# Patient Record
Sex: Male | Born: 1935 | Race: White | Smoking: Never smoker
Health system: Northeastern US, Academic
[De-identification: ages and names within clinical notes are randomized; demographics above are authoritative.]

## PROBLEM LIST (undated history)

## (undated) DIAGNOSIS — Z87442 Personal history of urinary calculi: Secondary | ICD-10-CM

## (undated) DIAGNOSIS — E785 Hyperlipidemia, unspecified: Secondary | ICD-10-CM

## (undated) DIAGNOSIS — L4050 Arthropathic psoriasis, unspecified: Secondary | ICD-10-CM

## (undated) DIAGNOSIS — E119 Type 2 diabetes mellitus without complications: Secondary | ICD-10-CM

## (undated) DIAGNOSIS — I2510 Atherosclerotic heart disease of native coronary artery without angina pectoris: Secondary | ICD-10-CM

## (undated) DIAGNOSIS — M6530 Trigger finger, unspecified finger: Secondary | ICD-10-CM

## (undated) DIAGNOSIS — I10 Essential (primary) hypertension: Secondary | ICD-10-CM

## (undated) HISTORY — PX: CHOLECYSTECTOMY: SHX55

## (undated) HISTORY — DX: Essential (primary) hypertension: I10

## (undated) HISTORY — PX: APPENDECTOMY: SHX54

## (undated) HISTORY — PX: UMBILICAL HERNIA REPAIR: SHX196

## (undated) HISTORY — DX: Arthropathic psoriasis, unspecified: L40.50

## (undated) HISTORY — PX: CORONARY ARTERY BYPASS GRAFT: SHX141

---

## 2000-05-27 DIAGNOSIS — Z951 Presence of aortocoronary bypass graft: Secondary | ICD-10-CM

## 2000-05-27 HISTORY — DX: Presence of aortocoronary bypass graft: Z95.1

## 2003-05-28 HISTORY — PX: CORONARY ARTERY BYPASS GRAFT: SHX141

## 2009-09-12 ENCOUNTER — Ambulatory Visit: Payer: Self-pay | Admitting: Ophthalmology

## 2009-09-13 ENCOUNTER — Ambulatory Visit: Payer: Self-pay | Admitting: Ophthalmology

## 2009-09-15 ENCOUNTER — Ambulatory Visit: Payer: Self-pay

## 2009-10-05 NOTE — H&P (Signed)
General H&P for inpatients    Chief Complaint: cataract    History of Present Illness:  HPI Comments: Pt states that he has been diagnosed with bil cataracts with the left eye worse than the right eye. It has somewhat affected his vision.  He is scheduled for surgery on 10/16/09       Past Medical History   Diagnosis Date   . Unspecified cataract 10/09/2009   . Diabetes mellitus    . Trigger finger      Past Surgical History   Procedure Date   . Appendectomy    . Cardiac surgery      Family History   Problem Relation Age of Onset   . Stroke Father    . Heart disease Neg Hx    . Diabetes Neg Hx    . Anesth problems Neg Hx      History   Social History   . Marital Status: Divorced     Spouse Name: N/A     Number of Children: N/A   . Years of Education: N/A   Social History Main Topics   . Smoking status: Never Smoker    . Smokeless tobacco: Never Used   . Alcohol Use: 4.2 oz/week     2 Glasses of wine, 5 Cans of beer per week   . Drug Use: No   . Sexually Active:    Other Topics Concern   . Not on file   Social History Narrative   . No narrative on file         Allergies: No Known Allergies    Prior to Admission Medications:    (Not in a hospital admission)      Active Hospital Medications:  Current outpatient prescriptions   Medication   . metformin (GLUCOPHAGE-XR) 500 MG 24 hr tablet   . metformin (GLUCOPHAGE-XR) 500 MG 24 hr tablet   . glimepiride (AMARYL) 2 MG tablet   . aspirin 81 MG EC tablet   . lisinopril (PRINIVIL,ZESTRIL) 5 MG tablet   . lovastatin (MEVACOR) 40 MG tablet         Review of Systems:   Review of Systems   Constitutional: Negative.    HENT: Negative for hearing loss, congestion and sore throat.         Crowns, implants   Eyes:        Cataracts- bil - left eye worse than right eye     Respiratory: Negative.  Negative for cough.    Cardiovascular: Negative for chest pain, palpitations and claudication.        Usual BP 120/69  Taking Lisinopril r/t diabetes  Denies murmur, valve disease  Testing:  stress/echo test 2009 after CABG  Dr Bebe Shaggy:  Last visit Spring 2010  Denies SOB with one stairs  Exercise: stationary bike and yardwork - very active   Gastrointestinal: Negative.    Genitourinary: Negative.    Musculoskeletal: Negative.    Skin: Negative.    Neurological: Negative.    Endo/Heme/Allergies: Negative for environmental allergies. Does not bruise/bleed easily.        Diabetes: treated about 9-10 years -   FBG - does not check - used to run 140  HgA1C = 6.5-7  Denies retinopathy, nephropathy, neuropathy   Psychiatric/Behavioral: Negative.        Last Nursing documented pain:  0-10 Scale: 0 (10/09/09 1347)      Patient Vitals in the past 24 hrs:   BP Temp Temp src Pulse Resp  SpO2 Height Weight   10/09/09 1347 154/79 mmHg 36.3 C (97.3 F) TEMPORAL 55  16  99 % 1.75 m (5' 8.9") 74.8 kg (164 lb 14.5 oz)       O2 Device: None  (10/09/09 1347)          Physical Exam   Constitutional: He is oriented to person, place, and time. He appears well-developed and well-nourished.   HENT:   Head: Normocephalic and atraumatic.   Right Ear: External ear normal.   Left Ear: External ear normal.   Mouth/Throat: Oropharynx is clear and moist.        Mild - diminished red reflex   Eyes: Conjunctivae and EOM are normal. Pupils are equal, round, and reactive to light.   Neck: Normal range of motion. Neck supple. No thyromegaly present.   Cardiovascular: Normal rate, regular rhythm, normal heart sounds and intact distal pulses.    Pulmonary/Chest: Effort normal and breath sounds normal.   Abdominal: Soft. Bowel sounds are normal.   Musculoskeletal: Normal range of motion.   Neurological: He is oriented to person, place, and time.   Skin: Skin is warm and dry.   Psychiatric: He has a normal mood and affect. His behavior is normal. Judgment and thought content normal.       Lab Results: none    Radiology impressions (last 3 days):  No results found.    Currently Active/Followed Hospital Problems:  Active Hospital Problems    Diagnoses   . Marland Kitchen*Unspecified cataract         Assessment: Ophthalmology Assessment: Duff Franchini is a 76 y.o. male who is diagnosed with  Left cataract  presenting for preop evaluation    Pt scheduled for  Left phaco w/crystalens IOL on 10/16/09  with Dr.  [place] Skene   [facility]     Ophthalmology Pre op Plan:  NPO after midnight  Verbalized knowledge and teaching objectives met  No barriers to learning identified  Teaching sheet reviewed with patient/family  IV insertion  Call surgeon if patient becomes ill prior to surgery  Instructed in pain scale/pain management  Care of valuables per [facility] policy  Instructed patient not to wear jewelry DOS  Questions answered  Medications DOS with sip of water: none  Hold medications AM day of surgery: Glimepiride, Lisinopril  Stop taking Metformin after AM dose on Sunday, May 22  Hold ASA/NSAIDS 7 days before surgery  Transportation home: arranged    Labs: EKG, BG  Lab DOS: BG    Author: Alphonzo Cruise  Note created: 10/09/2009  at: 2:34 PM

## 2009-10-09 ENCOUNTER — Encounter: Payer: Self-pay | Admitting: Ophthalmology

## 2009-10-09 ENCOUNTER — Ambulatory Visit: Payer: Self-pay | Admitting: Ophthalmology

## 2009-10-09 ENCOUNTER — Ambulatory Visit
Admit: 2009-10-09 | Discharge: 2009-10-09 | Disposition: A | Discharge status: Home / Self Care | Payer: Self-pay | Source: Ambulatory Visit | Attending: Ophthalmology | Admitting: Ophthalmology

## 2009-10-09 ENCOUNTER — Other Ambulatory Visit: Payer: Self-pay | Admitting: Gastroenterology

## 2009-10-09 DIAGNOSIS — H269 Unspecified cataract: Secondary | ICD-10-CM | POA: Insufficient documentation

## 2009-10-09 HISTORY — DX: Trigger finger, unspecified finger: M65.30

## 2009-10-09 HISTORY — DX: Type 2 diabetes mellitus without complications: E11.9

## 2009-10-09 HISTORY — DX: Unspecified cataract: H26.9

## 2009-10-09 LAB — POCT GLUCOSE: Glucose POCT: 79 mg/dL (ref 74–106)

## 2009-10-09 NOTE — Discharge Instructions (Addendum)
[facility]  Fax # [phone]  ADULT OPTHAMOLOGY   PREPROCEDURE PATIENT INSTRUCTIONS  SECOND FLOOR [facility]       NAME: Alejandro Stephenson    ADMISSION   DATE: Monday May 23       1) On Friday May 20  CALL [phone] between 2:30 PM and 4:30 PM on the day before your procedure to find out:    the time to arrive at the [facility] and the time of your procedure*  Note: Patients scheduled for a procedure on Monday should call the Friday before.  *Please note surgery start time is approximate. You may want to bring something to help pass the time. PLEASE ARRIVE ON TIME.      2) DIRECTIONS TO [facility]: On the day of your procedure, park in the parking garage and take the elevator/stairs to Level One (1), then follow the walkway to the Main Lobby. Walk past the Information Desk located in the center of the lobby and continue until you enter a main hallway with colored ceiling tags. Follow the GREEN (G) ceiling tags to the GREEN elevators.   (Please note that Valet Parking is available outside the front entrance of the hospital between 8:30 AM and 4:00 PM.)     [facility] - Second Floor (Level 2): take the GREEN elevators to the Second Floor (Level 2). Follow the signs to the [facility] - 2nd Floor [facility]) and check in with the receptionist at the desk.      3) EATING GUIDELINES: Follow the instructions below unless directed by your physician. Starting: Sunday May 22    Nothing to eat or drink after midnight (No candy, mints, gum or water) on the day of your surgery.    Failure to follow these instructions could lead to a delay or cancellation of your procedure.      4) MEDICATIONS:   5) On the morning of surgery, take your regular medications at the usual time or before leaving for the hospital, which ever comes first (unless otherwise instructed) including: none  6)  Do not take the following medications on the morning of surgery: Lisinopril,  Glimepiride,   7) Stop taking Metformin after your morning dose on Sunday, May 22        If you are on medications for diabetes, you should discuss the dose with your surgeon or nurse practitioner.    Aspirin/anti-inflammatory products: Do not take any aspirin products for 7 days before the procedure date, unless you are otherwise instructed by your health care provider.  Avoid non-steroidal anti-inflammatory agents such as Ibuprofen (Advil, Motrin) or Naproxen (Aleve) 7 days before the procedure.      Tylenol (Acetaminophen) may be taken if needed.    During your pre-operative visit, discuss all the medications you are currently taking with the anesthesiology team member. If you have any questions, please contact the [facility] during regular business hours at [phone], 8:00 AM to 4:00 PM.    8) Before coming to the hospital, remove all makeup, jewelry (including wedding band and hair accessories) and nail polish. Do not bring any valuables (money, wallet, purse, or jewelry).     9) Call your doctor if you become ill before the procedure day.    10) If you are going home on the same day as your surgery    Bring in any crutches or braces your surgeon has instructed you to bring.  Most people are ready for discharge one half hour to two hours after returning to the Surgical Center. You will be given discharge instructions before you go home.    11) Your family will be directed to a waiting area when you are taken to surgery.    We ask that only one or two family members accompany you on the day of your procedure.                  No children under the age of 65 are allowed as visitors in [facility]    Your family will be notified when your surgery is completed and you have arrived on the patient care unit.     12) Health standards require that a responsible adult must accompany any patient who has received anesthetics or sedatives and is going home the same day.     You must  arrange a ride home before coming to surgery.    11) Financial questions should be directed to [phone]

## 2009-10-10 ENCOUNTER — Ambulatory Visit: Payer: Self-pay | Admitting: Ophthalmology

## 2009-10-10 LAB — EKG 12-LEAD
P: 24 degrees
PR: 192 ms
QRS: 55 degrees
QRSD: 80 ms
QT: 424 ms
QTc: 395 ms
Rate: 52 {beats}/min
Severity: ABNORMAL
T: 23 degrees

## 2009-10-16 ENCOUNTER — Ambulatory Visit
Admit: 2009-10-16 | Discharge: 2009-10-16 | Disposition: A | Discharge status: Home / Self Care | Payer: Self-pay | Source: Ambulatory Visit | Attending: Ophthalmology | Admitting: Ophthalmology

## 2009-10-16 ENCOUNTER — Encounter: Payer: Self-pay | Admitting: Ophthalmology

## 2009-10-16 ENCOUNTER — Ambulatory Visit: Payer: Self-pay | Admitting: Ophthalmology

## 2009-10-16 HISTORY — DX: Atherosclerotic heart disease of native coronary artery without angina pectoris: I25.10

## 2009-10-16 LAB — POCT GLUCOSE
Glucose POCT: 77 mg/dL (ref 74–106)
Glucose POCT: 104 mg/dL (ref 74–106)

## 2009-10-16 MED ORDER — FLURBIPROFEN SODIUM 0.03 % OP SOLN *I*
1.0000 [drp] | Freq: Once | OPHTHALMIC | Status: AC
Start: 2009-10-16 — End: 2009-10-16

## 2009-10-16 MED ORDER — CYCLOPENTOLATE-PHENYLEPHRINE-MOXIFLOXACIN-TROPICAMIDE (VIGAMOX POWER) *I*
1.0000 [drp] | OPHTHALMIC | Status: AC
Start: 2009-10-16 — End: 2009-10-16
  Administered 2009-10-16 (×2): 1 [drp] via OPHTHALMIC

## 2009-10-16 MED ORDER — CYCLOPENTOLATE-PHENYLEPHRINE-MOXIFLOXACIN-TROPICAMIDE (VIGAMOX POWER) *I*
1.0000 [drp] | OPHTHALMIC | Status: AC
Start: 2009-10-16 — End: 2009-10-16

## 2009-10-16 MED ORDER — LACTATED RINGERS IV SOLN *I*
20.0000 mL/h | INTRAVENOUS | Status: DC
Start: 2009-10-16 — End: 2009-10-17
  Administered 2009-10-16: 20 mL/h via INTRAVENOUS

## 2009-10-16 MED ORDER — LIDOCAINE HCL 1 % IJ SOLN
0.1000 mL | INTRAMUSCULAR | Status: DC | PRN
Start: 2009-10-16 — End: 2009-10-17

## 2009-10-16 MED ORDER — SODIUM CHLORIDE 0.9 % IV SOLN WRAPPED *I*
20.0000 mL/h | Status: DC
Start: 2009-10-16 — End: 2009-10-17

## 2009-10-16 MED ORDER — FLURBIPROFEN SODIUM 0.03 % OP SOLN *I*
OPHTHALMIC | Status: AC
Start: 2009-10-16 — End: 2009-10-16
  Administered 2009-10-16: 1 [drp] via OPHTHALMIC
  Filled 2009-10-16: qty 2.5

## 2009-10-16 MED ORDER — CYCLOPENTOLATE-PHENYLEPHRINE-MOXIFLOXACIN-TROPICAMIDE (VIGAMOX POWER) *I*
OPHTHALMIC | Status: AC
Start: 2009-10-16 — End: 2009-10-16
  Administered 2009-10-16: 1 [drp] via OPHTHALMIC
  Filled 2009-10-16: qty 5

## 2009-10-16 NOTE — H&P (Signed)
UPDATES TO PATIENT'S CONDITION on the DAY OF SURGERY/PROCEDURE    I. Updates to Patient's Condition (to be completed by a provider privileged to complete a H&P, following reassessment of the patient by the provider):    Full H&P done ; no updates needed.       II. Procedure Readiness   I have reviewed the patient's H&P and updated condition. By completing and signing this form, I attest that this patient is ready for surgery.      III. Attestation   I have reviewed the updated information regarding the patient's condition and it is appropriate to proceed with the planned surgery/procedure.    Presley Raddle, MD as of 5:00 PM 10/16/2009

## 2009-10-16 NOTE — Anesthesia Pre-procedure Eval (Signed)
Anesthesia Pre-operative Evaluation for Alejandro Stephenson  Health History  Past Medical History   Diagnosis Date   . Unspecified cataract 10/09/2009   . Diabetes mellitus    . Trigger finger    . Coronary heart disease      s/p CABG       Past Surgical History   Procedure Date   . Appendectomy    . Coronary artery bypass graft 2005   . Cholecystectomy    . Umbilical hernia repair        Social History  History   Substance Use Topics   . Smoking status: Never Smoker    . Smokeless tobacco: Never Used   . Alcohol Use: 4.2 oz/week     2 Glasses of wine, 5 Cans of beer per week      History   Drug Use No       Allergies: No Known Allergies  Medications     (Not in a hospital admission)   Current outpatient prescriptions   Medication   . metformin (GLUCOPHAGE-XR) 500 MG 24 hr tablet   . metformin (GLUCOPHAGE-XR) 500 MG 24 hr tablet   . glimepiride (AMARYL) 2 MG tablet   . aspirin 81 MG EC tablet   . lisinopril (PRINIVIL,ZESTRIL) 5 MG tablet   . lovastatin (MEVACOR) 40 MG tablet   Current facility-administered medications   Medication Dose Route Frequency   . Lactated Ringers Infusion   20 mL/hr Intravenous Continuous   . sodium chloride IV  20 mL/hr Intravenous Continuous   . lidocaine 1 % injection 0.1 mL  0.1 mL Subcutaneous PRN   . flurbiprofen (OCUFEN) 0.03 % ophthalmic solution 1 drop  1 drop Left Eye Once   . Power Drop ophthalmic solution SOLN 1 drop  1 drop Left Eye Q5 Min   . Power Drop ophthalmic solution SOLN 1 drop  1 drop Left Eye Q30 Min       Medications Administered by Facility in Past 24hrs  flurbiprofen (OCUFEN) 0.03 % ophthalmic solution 1 drop    Date Action Dose Route User    10/16/2009 1432 Given 1 drop Left Eye Mary Sella, RN      Lactated Ringers Infusion     Date Action Dose Route User    10/16/2009 1442 New Bag 20 mL/hr Intravenous Mary Sella, RN      Power Drop ophthalmic solution SOLN 1 drop    Date Action Dose Route User    10/16/2009 1454 Given 1 drop Left Eye Mary Sella, RN    10/16/2009 1448 Given 1 drop Left Eye Mary Sella, RN    10/16/2009 1440 Given 1 drop Left Eye Mary Sella, RN             Anesthesia Evaluation      Airway   Mallampati: II  TM distance: >3 FB  Neck ROM: full  Dental    Comment: Crowns on lower  molars    Pulmonary - normal exam    breath sounds clear to auscultation  Cardiovascular - normal exam  Exercise tolerance: good (Does physical work and exercises regularly)  (+) CAD,   Rhythm: regular    Rate: normal  ROS comment: Asymptomatic CAD both before and after CABG.  No hypertension; lisinopril is for renal protection    Neuro/Psych - negative ROS   GI/Hepatic/Renal - negative ROS     Endo/Other    (+) Type II DM well controlled,   Abdominal  Additional ROS/Co-morbidities: None known    Mental Status: alert, oriented to person, place, and time, normal mood, behavior, speech, dress, motor activity, and thought processes    Last PO Intake: 7:00 pm 5/22    Most Recent Vitals: BP 136/73  Pulse 60  Temp(Src) 36.2 C (97.2 F) (Temporal)  Resp 18  Ht 1.75 m (5' 8.9")  Wt 73.6 kg (162 lb 4.1 oz)  BMI 24.03 kg/m2  SpO2 99%  Vital Sign Ranges (last 24hrs)  Temp:  [36.2 C (97.2 F)] 36.2 C (97.2 F)  Heart Rate:  [60] 60   Resp:  [18] 18   BP: (136)/(73) 136/73 mmHg   O2 Device: None  (10/16/09 1426)      Most Recent Lab Results   CBC  No results found for this basename: wbc,  hct,  plt      Chem-7  Lab Results   Component Value Date    PGLU 104 10/16/2009       No results found for this basename: GFR,  PGFRB,  EGFR,  GFRAA,  GFRNONAA        Electrolytes  No results found for this basename: Calcium,  Mg,  Phos      Coags  No results found for this basename: Protime,  INR,  APTT      LFTs  No results found for this basename: AST,  ALT,  ALKPHOS      No results found for this basename: HTBIL,  bili         Pregnancy Test (if applicable)   No results found for this basename: PUPT,   PREGTESTUR,   PREGSERUM,   HCG,   HCGQUANT        EKG  Results  SINUS RHYTHM  . CONSIDER ANTEROSEPTAL INFARCT  * NO PRIOR TRACING    BUN   No results found for this basename: BUN     CBC   No results found for this basename: WBC,  HGB,  HCT,  MCV,  PLT     Creatinine   No results found for this basename: creatinine       Platelets   No results found for this basename: PLT     Potassium   No results found for this basename: K     Sodium   No results found for this basename: sodium         Medical Problems  Patient Active Problem List   Diagnoses Date Noted   . Unspecified cataract [366.9] 10/09/2009       PreOp/PreAn Diagnosis: Cataract    Planned Procedure: ECCE    Anesthesia Plan    ASA 2   MAC with intravenous induction    Anesthetic plan and risks discussed with patient and spouse.    Plan discussed with resident.        Anesthesia Risks discussed: nausea, vomiting, airway problems, eye injury, allergic reaction, nerve injury, unexpected serious injury and death     Invasive Monitoring discussed:  none    Attending Attestation: The patient or proxy understand and accept the risks and benefits of the anesthesia plan. By accepting this note, I attest that I have personally performed the history and physical exam and prescribed the anesthetic plan within 48 hours prior to the anesthetic as documented by me above.    Author: Luiz Blare, MD Note created: 10/16/2009  at: 5:03 PM

## 2009-10-16 NOTE — Progress Notes (Signed)
1809  Returned from OR via Doctor, general practice.  Assisted OOB to recliner chair; steady on feet.   1820 Dr.  Skene in to see pt.

## 2009-10-16 NOTE — Progress Notes (Signed)
1850  Pt thought that his appt tomorrrow was @ 3:00 pm, Dr.Macrae phoned, and appointment for 5/24 confirmed to be @ 8:00.  Pt aware, and time writtenn on his d/c instructions.

## 2009-10-16 NOTE — Anesthesia Post-procedure Eval (Signed)
Anesthesia Post-op Note    Patient: Alejandro Stephenson    Procedure(s) Performed: cataract  Anesthesia type: MAC    Patient location: [facility]    Mental Status: Recovered to baseline    Patient able to participate in this evaluation: yes    Last Vitals: BP 136/73  Pulse 60  Temp(Src) 36.2 C (97.2 F) (Temporal)  Resp 18  Ht 1.75 m (5' 8.9")  Wt 73.6 kg (162 lb 4.1 oz)  BMI 24.03 kg/m2  SpO2 99%     Post-op vital signs noted above are within patient's normal range  Post-op vitals signs: stable  Respiratory function: baseline    Airway patent: Yes    Cardiovascular and hydration status stable: Yes    Post-Op pain: Adequate analgesia    Post-Op assessment: no apparent anesthetic complications    Complications: none    Attending Attestation: All indicated post anesthesia care provided    Author: Jodi Marble, MD  as of: 10/16/2009  at: 6:14 PM

## 2009-10-16 NOTE — Anesthesia Pre-procedure Eval (Signed)
Anesthesia Pre-operative Evaluation for Alejandro Stephenson  Health History  Past Medical History   Diagnosis Date   . Unspecified cataract 10/09/2009   . Diabetes mellitus    . Trigger finger    . Coronary heart disease      s/p CABG       Past Surgical History   Procedure Date   . Appendectomy    . Coronary artery bypass graft 2005   . Cholecystectomy    . Umbilical hernia repair        Social History  History   Substance Use Topics   . Smoking status: Never Smoker    . Smokeless tobacco: Never Used   . Alcohol Use: 4.2 oz/week     2 Glasses of wine, 5 Cans of beer per week      History   Drug Use No       Allergies: No Known Allergies  Medications     (Not in a hospital admission)   Current outpatient prescriptions   Medication   . metformin (GLUCOPHAGE-XR) 500 MG 24 hr tablet   . metformin (GLUCOPHAGE-XR) 500 MG 24 hr tablet   . glimepiride (AMARYL) 2 MG tablet   . aspirin 81 MG EC tablet   . lisinopril (PRINIVIL,ZESTRIL) 5 MG tablet   . lovastatin (MEVACOR) 40 MG tablet   Current facility-administered medications   Medication Dose Route Frequency   . Lactated Ringers Infusion   20 mL/hr Intravenous Continuous   . sodium chloride IV  20 mL/hr Intravenous Continuous   . lidocaine 1 % injection 0.1 mL  0.1 mL Subcutaneous PRN   . flurbiprofen (OCUFEN) 0.03 % ophthalmic solution 1 drop  1 drop Left Eye Once   . Power Drop ophthalmic solution SOLN 1 drop  1 drop Left Eye Q5 Min   . Power Drop ophthalmic solution SOLN 1 drop  1 drop Left Eye Q30 Min       Medications Administered by Facility in Past 24hrs  flurbiprofen (OCUFEN) 0.03 % ophthalmic solution 1 drop    Date Action Dose Route User    10/16/2009 1432 Given 1 drop Left Eye Mary Sella, RN      Lactated Ringers Infusion     Date Action Dose Route User    10/16/2009 1442 New Bag 20 mL/hr Intravenous Mary Sella, RN      Power Drop ophthalmic solution SOLN 1 drop    Date Action Dose Route User    10/16/2009 1454 Given 1 drop Left Eye Mary Sella, RN    10/16/2009 1448 Given 1 drop Left Eye Mary Sella, RN    10/16/2009 1440 Given 1 drop Left Eye Mary Sella, RN             Anesthesia Evaluation      Airway   Mallampati: II  TM distance: >3 FB  Neck ROM: full  Dental    Comment: Crowns on lower  molars    Pulmonary - normal exam    breath sounds clear to auscultation  Cardiovascular - normal exam  Exercise tolerance: good (Does physical work and exercises regularly)  (+) CAD,   Rhythm: regular    Rate: normal  ROS comment: Asymptomatic CAD both before and after CABG.  No hypertension; lisinopril is for renal protection    Neuro/Psych - negative ROS   GI/Hepatic/Renal - negative ROS     Endo/Other    (+) Type II DM well controlled,   Abdominal  Additional ROS/Co-morbidities: None known    Mental Status: alert, oriented to person, place, and time, normal mood, behavior, speech, dress, motor activity, and thought processes    Last PO Intake: 7:00 pm 5/22    Most Recent Vitals: BP 136/73  Pulse 60  Temp(Src) 36.2 C (97.2 F) (Temporal)  Resp 18  Ht 1.75 m (5' 8.9")  Wt 73.6 kg (162 lb 4.1 oz)  BMI 24.03 kg/m2  SpO2 99%  Vital Sign Ranges (last 24hrs)  Temp:  [36.2 C (97.2 F)] 36.2 C (97.2 F)  Heart Rate:  [60] 60   Resp:  [18] 18   BP: (136)/(73) 136/73 mmHg   O2 Device: None  (10/16/09 1426)      Most Recent Lab Results   CBC  No results found for this basename: wbc,  hct,  plt      Chem-7  Lab Results   Component Value Date    PGLU 104 10/16/2009       No results found for this basename: GFR,  PGFRB,  EGFR,  GFRAA,  GFRNONAA        Electrolytes  No results found for this basename: Calcium,  Mg,  Phos      Coags  No results found for this basename: Protime,  INR,  APTT      LFTs  No results found for this basename: AST,  ALT,  ALKPHOS      No results found for this basename: HTBIL,  bili         Pregnancy Test (if applicable)   No results found for this basename: PUPT,   PREGTESTUR,   PREGSERUM,   HCG,   HCGQUANT        EKG  Results  SINUS RHYTHM  . CONSIDER ANTEROSEPTAL INFARCT  * NO PRIOR TRACING    BUN   No results found for this basename: BUN     CBC   No results found for this basename: WBC,  HGB,  HCT,  MCV,  PLT     Creatinine   No results found for this basename: creatinine       Platelets   No results found for this basename: PLT     Potassium   No results found for this basename: K     Sodium   No results found for this basename: sodium         Medical Problems  Patient Active Problem List   Diagnoses Date Noted   . Unspecified cataract [366.9] 10/09/2009       PreOp/PreAn Diagnosis: Cataract    Planned Procedure: ECCE    Anesthesia Plan    ASA 2   MAC with intravenous induction    Anesthetic plan and risks discussed with patient and spouse.    Plan discussed with resident.        Anesthesia Risks discussed: nausea, vomiting, airway problems, eye injury, allergic reaction, nerve injury, unexpected serious injury and death     Invasive Monitoring discussed:  none    Attending Attestation: The patient or proxy understand and accept the risks and benefits of the anesthesia plan. By accepting this note, I attest that I have personally performed the history and physical exam and prescribed the anesthetic plan within 48 hours prior to the anesthetic as documented by me above.    Author: Fleeta Emmer, MD Note created: 10/16/2009  at: 4:16 PM

## 2009-10-16 NOTE — INTERIM OP NOTE (Signed)
Interim Operative Report    Surgeon: Hel[place] Skene  Co-Surgeon:   First Assistant:   Second Assistant:     Pre-Op Diagnosis: cataract os    Anesthesia Type: Monitored Anesthesia Care    Post-Op Diagnosis    Primarysame  Secondary:   Tertiary:     Additional Findings (Including unexpected complications): none    Procedure(s) Performed (including CPT 4 Code if available)   phaco with iol placement    Estimated Blood Loss:    Packing: No  Drains: No  Fluid Totals: Intakes:  Outputs:   Specimens to Pathology: no  Patient Condition: good    Arlando Leisinger MOHAMMED Mikka Kissner, MD as of 5:39 PM, 10/16/2009

## 2009-10-16 NOTE — Discharge Instructions (Signed)
Procedure:  Cataract Extraction left eye    You have received sedative medications and/or general anesthesia which make you drowsy for as long as 24 hours:  1) DO NOT drive or operate any machinery for 24hours.  2) DO NOT drink alcoholic beverages for 24hours.  3) DO NOT make major decisions, sign contract, etc. for 24hours.    Discomfort after Surgery  - A scratchy felling or occasional sharp pain is normal.  - You may take actaminophen (Tylenol) for mild pain.  - If you have a severe headache or nausea, call your doctor at [phone].    Eye care after surgery  - Leave the patch on until your appointment tomorrow.  - Keep the patch dry.  - Redness is common and gradually diminishes over time.  Blood stained tears can occur.  - In certain cases, positioning of the head will be necessary.    Activity after surgery  - You must avoid lifting heavy objects (over 5 pounds) and bending over.  - You may read and watch TV.  - You may drive when your doctor gives you permission.    Diet after surgery  - Resume previous diet.    Medications  - Resume usual medications  - New Medications:  Tylenol as needed for pain.     Follow up  - Please follow up as scheduled.  - At your appointment the doctor will remove the patch and you will be given care instructions.     - If you have an emergency and are unable to contact your doctor at the [facility] [phone]   [facility] Maxx Calaway, MD  10/16/2009

## 2009-10-17 ENCOUNTER — Ambulatory Visit: Payer: Self-pay | Admitting: Ophthalmology

## 2009-10-17 NOTE — Op Note (Signed)
SURGEON:  Presley Raddle, MD  CO-SURGEON:  ASSISTANT:  SURGERY DATE:  10/16/2009    PREOPERATIVE DIAGNOSIS:   Cataract, OS.    POSTOPERATIVE DIAGNOSIS:  Cataract, OS.    OPERATIVE PROCEDURE:      Phacoemulsification with Crystalens implantation,  OS.    ANESTHESIA: MAC.    DESCRIPTION OF PROCEDURE:              The patient was brought into the  operating room and prepped in the usual fashion.  Several drops of 5%  povidone-iodine were instilled into the cul-de-sac and the eye draped in  the usual fashion.  Lieberman lid speculum was then used to retract the  lids.  Intracameral lidocaine was injected into the [facility].  A 1 mm side-port  incision was performed.  A clear corneal 2.8 mm trapezoidal incision was  performed.  Viscoelastic was instilled into the posterior chamber.  A  continuous curvilinear capsulorrhexis was then performed.  Hydrodissection  and hydrodelineation were then performed.  The lens was removed using a  divide-and-conquer technique.  Irrigation and aspiration were used to  complete the cortical clean up.  The I/A aspiration instrument was also  used for gentle capsule polishing.  The right and left capsule polishers  were then used to further polish the anterior and posterior capsule,  removing any fine cortical microfilaments.  Viscoelastic was instilled into  the posterior chamber. A Crystalens [id number], serial [id number], posterior  chamber intraocular lens was implanted into the posterior chamber using  Crystalsert system.  The lens power was 19.5 diopters.  The intraocular  lens was then rotated.  Careful inspection was performed of all haptics and  they were in proper position in the posterior chamber.  Viscoelastic was  then removed using the irrigation and aspiration instrument.  The wounds  were then hydrated with balanced salt solution, and the anterior chamber  and posterior chambers were re-formed.  Vancomycin 0.1 cc (1 mg/0.1 cc) was  then instilled into the anterior chamber.  The  anterior chamber again was  re-inflated slightly with balanced salt solution to ensure wounds were  sealed.  The wounds were then carefully inspected with a Weck-cel sponge  and determined to be well sealed.  The intraocular pressure was checked  with Weck-cel pressure at the 6 o'clock position.  Intraocular pressure was  good, but not excessive.  The patient was given TobraDex ointment, Timoptic  0.5%, Iopidine topically and patched and shielded.  The patient was taken  to recovery in good condition.  There were no operative complications.  The  level of anesthesia was excellent.              Electronically Signed and Finalized  by  Presley Raddle, MD 10/19/2009 13:06  _____________________________________________  Presley Raddle, MD      DD:   10/16/2009  DT:   10/17/2009  7:54 A  DVI:  [id number]  SMM/SS2#[id number]    cc:   Presley Raddle, MD

## 2009-10-18 MED FILL — Fentanyl Citrate Preservative Free (PF) Inj 100 MCG/2ML: INTRAMUSCULAR | Qty: 2 | Status: AC

## 2009-10-18 MED FILL — Midazolam HCl Inj 2 MG/2ML (Base Equivalent): INTRAMUSCULAR | Qty: 2 | Status: AC

## 2009-10-25 ENCOUNTER — Ambulatory Visit: Payer: Self-pay | Admitting: Ophthalmology

## 2009-11-16 ENCOUNTER — Ambulatory Visit: Payer: Self-pay | Admitting: Ophthalmology

## 2009-11-28 ENCOUNTER — Ambulatory Visit: Payer: Self-pay | Admitting: Ophthalmology

## 2009-11-29 ENCOUNTER — Ambulatory Visit: Payer: Self-pay | Admitting: Ophthalmology

## 2009-12-06 ENCOUNTER — Ambulatory Visit: Payer: Self-pay | Admitting: Ophthalmology

## 2010-01-03 ENCOUNTER — Ambulatory Visit: Payer: Self-pay | Admitting: Ophthalmology

## 2010-03-28 ENCOUNTER — Ambulatory Visit: Payer: Self-pay | Admitting: Ophthalmology

## 2014-12-29 DIAGNOSIS — E785 Hyperlipidemia, unspecified: Secondary | ICD-10-CM | POA: Insufficient documentation

## 2014-12-29 DIAGNOSIS — I10 Essential (primary) hypertension: Secondary | ICD-10-CM | POA: Insufficient documentation

## 2014-12-30 ENCOUNTER — Ambulatory Visit: Payer: Self-pay | Admitting: Cardiology

## 2014-12-30 ENCOUNTER — Encounter: Payer: Self-pay | Admitting: Cardiology

## 2014-12-30 ENCOUNTER — Other Ambulatory Visit: Payer: Self-pay | Admitting: Gastroenterology

## 2014-12-30 DIAGNOSIS — I2510 Atherosclerotic heart disease of native coronary artery without angina pectoris: Secondary | ICD-10-CM

## 2014-12-30 NOTE — Progress Notes (Signed)
Cardiology Office Revisit Note    Date of Visit: 12/30/2014 Patient: Alejandro Stephenson   Patients PCP: Mannie Stabile, MD Patient DOB: May 09, 1934     Subjective/Reason For Visit     I had the pleasure of seeing Alejandro Stephenson in cardiology followup on 12/30/2014 for coronary artery disease.  81 year old gentleman with a history of bypass surgery November 2005.  Since I last saw him he's had no symptoms.  No chest pain or shortness of breath.  He spends the winters in [place].  He has some arthritis but otherwise stays active.    Past Medical History   Diagnosis Date    Coronary heart disease      s/p CABG    Diabetes mellitus     Hypertension     Psoriatic arthritis     Trigger finger     Unspecified cataract 10/09/2009     Past Surgical History   Procedure Laterality Date    Appendectomy      Coronary artery bypass graft  2005    Cholecystectomy      Umbilical hernia repair         Review of Systems   Constitutional: Negative.    HENT: Negative.    Eyes: Negative.    Respiratory: Negative.    Cardiovascular: Negative.    Gastrointestinal: Negative.    Genitourinary: Negative.    Musculoskeletal: Positive for joint pain.   Skin: Negative.    Neurological: Negative.    Endo/Heme/Allergies: Negative.    Psychiatric/Behavioral: Negative.      Medications     Current Outpatient Prescriptions   Medication Sig    sulfaSALAzine (AZULFIDINE) 500 MG tablet Take 500 mg by mouth 2 times daily    metformin (GLUCOPHAGE-XR) 500 MG 24 hr tablet Take 1,000 mg by mouth 2 times daily       glimepiride (AMARYL) 2 MG tablet Take 4 mg by mouth 2 times daily       aspirin 81 MG EC tablet Take 81 mg by mouth every morning.    lisinopril (PRINIVIL,ZESTRIL) 5 MG tablet Take 5 mg by mouth daily.    lovastatin (MEVACOR) 40 MG tablet Take 40 mg by mouth nightly.     Vitals and Physical Exam     [name]'s  height is 1.702 m ( ) and weight is 69.4 kg (153 lb). His blood pressure is 120/62 and his pulse is 69.  Body mass index is  23.96 kg/(m^2).    Physical Exam   Constitutional: He is oriented to person, place, and time. He appears well-developed and well-nourished. No distress.   HENT:   Head: Normocephalic and atraumatic.   Eyes: Pupils are equal, round, and reactive to light. No scleral icterus.   Neck: Normal range of motion. Neck supple. No JVD present. Carotid bruit is not present. No thyromegaly present.   Cardiovascular: Normal rate, regular rhythm and intact distal pulses.  Exam reveals no gallop and no friction rub.    Murmur heard.  2/6 systolic murmur   Pulmonary/Chest: Effort normal and breath sounds normal. No respiratory distress. He has no wheezes. He has no rales.   Abdominal: Soft. Bowel sounds are normal. There is no tenderness.   Musculoskeletal: He exhibits no edema or tenderness.   Neurological: He is alert and oriented to person, place, and time.   Skin: Skin is warm and dry. No rash noted. He is not diaphoretic. No erythema. No pallor.   Psychiatric: He has a normal mood and affect.  His behavior is normal. Judgment and thought content normal.   Vitals reviewed.    Cardiac/Imaging Data     ECG: NSR.        Impression and Plan     Patient Active Problem List   Diagnosis Code    Unspecified cataract H26.9    HTN (hypertension) I10    Hyperlipidemia E78.5    Coronary heart disease I25.10       This is an 81 y.o. male with history of bypass surgery 11 years ago.  Clinically stable.  We'll continue current medications.  Continue risk factor modification.  We'll check stress echo at next visit.  He has a murmur of aortic stenosis developing.  If he has symptoms he'll get in contact with [place].  Return to practice next May when he returns from [place].  All questions answered.       Madelaine Bhat, MD  Electronically signed on 12/30/2014 at 1:21 PM.

## 2015-01-02 LAB — EKG 12-LEAD
P: -11 degrees
QRS: 71 degrees
Rate: 69 {beats}/min
Severity: ABNORMAL
Severity: ABNORMAL
T: 39 degrees

## 2015-11-03 ENCOUNTER — Ambulatory Visit: Admit: 2015-11-03 | Discharge: 2015-11-03 | Disposition: A | Discharge status: Home / Self Care | Payer: Self-pay

## 2015-11-03 ENCOUNTER — Ambulatory Visit: Payer: Self-pay | Admitting: Cardiology

## 2015-11-03 ENCOUNTER — Ambulatory Visit: Admission: RE | Admit: 2015-11-03 | Payer: Self-pay | Source: Ambulatory Visit | Admitting: Cardiology

## 2015-11-03 VITALS — BP 132/72 | HR 60 | Ht 67.0 in | Wt 153.0 lb

## 2015-11-03 DIAGNOSIS — I2510 Atherosclerotic heart disease of native coronary artery without angina pectoris: Secondary | ICD-10-CM

## 2015-11-03 HISTORY — DX: Hyperlipidemia, unspecified: E78.5

## 2015-11-03 LAB — EXERCISE STRESS ECHO COMPLETE
Aortic Arch Diameter: 3.15 cm
Aortic Diameter (mid tubular): 3.61 cm
Aortic Diameter (sinus of Valsalva): 3.26 cm
BMI: 24 kg/m2
BP Diastolic: 72 mmHg
BP Systolic: 132 mmHg
BSA: 1.81 m2
E/A ratio: 0.66
Estimated workload: 7 METS
Heart Rate: 54 {beats}/min
Height: 67 in
LA Systolic Diameter: 3.26 cm
LV ASE Mass BSA Index: 75.9 gm/m2
LV ASE Mass Height 2.7 Index: 32.7 gm/m2.7
LV ASE Mass Height Index: 80.8 gm/m
LV ASE Mass: 137.5 gm
LV Posterior Wall Thickness: 1.01 cm
LV Septal Thickness: 0.96 cm
LV Subaortic Knuckle Thickness: 1.6 cm
LVED Diameter BSA Index: 2.4 cm/m2
LVED Diameter Height Index: 2.5 cm/m
LVED Diameter: 4.26 cm
LVES Diameter BSA Index: 1.6 cm/m2
LVES Diameter Height Index: 1.7 cm/m
LVES Diameter: 2.88 cm
LVOT Area (calculated): 3.14 cm2
LVOT Cardiac Index: 2.98 L/min/m2
LVOT Cardiac Output: 5.39 L/min
LVOT Diameter: 2 cm
LVOT PWD VTI: 31.8 cm
LVOT PWD Velocity (mean): 89.6 cm/s
LVOT PWD Velocity (peak): 130 cm/s
LVOT SV BSA Index: 55.17 mL/m2
LVOT SV Height Index: 58.7 mL/m
LVOT Stroke Rate (mean): 281.3 mL/s
LVOT Stroke Rate (peak): 408.2 mL/s
LVOT Stroke Volume: 99.85 cc
MPHR: 141 {beats}/min
MV Peak A Velocity: 126 cm/s
MV Peak E Velocity: 83.4 cm/s
Peak DBP: 68 mmHg
Peak Gradient - TR: 16 mmHg
Peak HR: 143 {beats}/min
Peak SBP: 172 mmHg
Percent MPHR: 101.4 %
RA Pressure Estimate: 3 mmHg
RPP: 24596 BPM x mmHG
RR Interval: 1111.11 ms
RV Peak Systolic Pressure: 19 mmHg
Stress Peak Stage: 1.99
Stress duration (min): 5 min
Stress duration (sec): 58 s
Weight: 2448 oz

## 2015-11-03 NOTE — Progress Notes (Signed)
Cardiology Office Revisit Note    Date of Visit: 11/03/2015 Patient: Alejandro Stephenson   Patients PCP: Alejandro Stephenson, Neal T, MD Patient DOB: Apr 04, 1934     Subjective/Reason For Visit     I had the pleasure of seeing Alejandro Stephenson in cardiology followup on 11/03/2015 for coronary artery disease.  82 year old gentleman with a history of bypass surgery 12 years ago.  At that time he was fairly asymptomatic.  He had a very positive stress test.  He's done well since.  He remains quite active.    Past Medical History:   Diagnosis Date    Coronary heart disease     s/p CABG    Diabetes mellitus     Hyperlipidemia     Hypertension     Psoriatic arthritis     Trigger finger     Unspecified cataract 10/09/2009     Past Surgical History:   Procedure Laterality Date    APPENDECTOMY      CHOLECYSTECTOMY      CORONARY ARTERY BYPASS GRAFT  2005    UMBILICAL HERNIA REPAIR         Review of Systems   Constitutional: Negative.    HENT: Negative.    Eyes: Negative.    Respiratory: Negative.    Cardiovascular: Negative.    Gastrointestinal: Negative.    Genitourinary: Negative.    Musculoskeletal: Positive for joint pain.   Skin: Negative.    Neurological: Negative.    Endo/Heme/Allergies: Negative.    Psychiatric/Behavioral: Negative.      Medications     Current Outpatient Prescriptions   Medication Sig    sulfaSALAzine (AZULFIDINE) 500 MG tablet Take 500 mg by mouth 2 times daily    metformin (GLUCOPHAGE-XR) 500 MG 24 hr tablet Take 1,000 mg by mouth 2 times daily       glimepiride (AMARYL) 2 MG tablet Take 4 mg by mouth 2 times daily       aspirin 81 MG EC tablet Take 81 mg by mouth every morning.    lisinopril (PRINIVIL,ZESTRIL) 5 MG tablet Take 5 mg by mouth daily.    lovastatin (MEVACOR) 40 MG tablet Take 40 mg by mouth nightly.     Vitals and Physical Exam     [name]'s  height is 1.702 m (5\' 7" ) and weight is 69.4 kg (153 lb). His blood pressure is 132/72 and his pulse is 60.  Body mass index is 23.96  kg/(m^2).    Physical Exam   Constitutional: He is oriented to person, place, and time. He appears well-developed and well-nourished. No distress.   HENT:   Head: Normocephalic and atraumatic.   Eyes: Pupils are equal, round, and reactive to light. No scleral icterus.   Neck: Normal range of motion. Neck supple. No JVD present. Carotid bruit is not present. No thyromegaly present.   Cardiovascular: Normal rate, regular rhythm and intact distal pulses.  Exam reveals no gallop and no friction rub.    No murmur heard.  Well-healed median sternotomy.   Pulmonary/Chest: Effort normal and breath sounds normal. No respiratory distress. He has no wheezes. He has no rales.   Abdominal: Soft. Bowel sounds are normal. There is no tenderness.   Musculoskeletal: He exhibits no edema or tenderness.   Neurological: He is alert and oriented to person, place, and time.   Skin: Skin is warm and dry. No rash noted. He is not diaphoretic. No erythema. No pallor.   Psychiatric: He has a normal mood and affect. His behavior  is normal. Judgment and thought content normal.   Vitals reviewed.    Cardiac/Imaging Data            Exercise Stress Echo Complete 11/03/2015    Narrative Normal LV systolic function.  No significant valvular heart disease.  No echocardiographic evidence of ischemia.  No exercise-induced chest pain.                   Impression and Plan     Patient Active Problem List   Diagnosis Code    Unspecified cataract H26.9    HTN (hypertension) I10    Hyperlipidemia E78.5    Coronary heart disease I25.10       This is an 82 y.o. male with coronary artery disease but no inducible ischemia.  We'll continue with risk factor modification.  He wants to stop his medications but I told him to continue.  They've gotten him this far.  He'll continue to exercise and diet.  All questions answered.  Return to practice 12 months.       Madelaine Bhat, MD  Electronically signed on 11/03/2015 at 2:19 PM.

## 2016-11-20 ENCOUNTER — Encounter: Payer: Self-pay | Admitting: Cardiology

## 2016-11-20 ENCOUNTER — Other Ambulatory Visit: Payer: Self-pay | Admitting: Cardiology

## 2016-11-20 ENCOUNTER — Ambulatory Visit: Payer: Medicare (Managed Care) | Attending: Cardiology | Admitting: Cardiology

## 2016-11-20 VITALS — BP 160/90 | HR 52 | Ht 70.0 in | Wt 149.0 lb

## 2016-11-20 DIAGNOSIS — I2510 Atherosclerotic heart disease of native coronary artery without angina pectoris: Secondary | ICD-10-CM

## 2016-11-20 NOTE — Progress Notes (Signed)
Cardiology Office Revisit Note    Date of Visit: 11/20/2016 Patient: Alejandro Stephenson   Patients PCP: Mannie Stabile, MD Patient DOB: 05-Feb-1934     Subjective/Reason For Visit     I had the pleasure of seeing Alejandro Stephenson in cardiology followup on 11/20/2016 for coronary artery disease status post bypass surgery in 2005.  He's never had symptoms.  He's been fairly resistant to taking medications as well.  He's going to be moving to [place].    Past Medical History:   Diagnosis Date    Coronary heart disease     s/p CABG    Diabetes mellitus     Hyperlipidemia     Hypertension     Psoriatic arthritis     Trigger finger     Unspecified cataract 10/09/2009     Past Surgical History:   Procedure Laterality Date    APPENDECTOMY      CHOLECYSTECTOMY      CORONARY ARTERY BYPASS GRAFT  2005    LIMA to LAD, SVG diag, OM and PDA separate.    UMBILICAL HERNIA REPAIR         Review of Systems   Constitutional: Negative.    HENT: Negative.    Eyes: Negative.    Respiratory: Negative.    Cardiovascular: Negative.    Gastrointestinal: Negative.    Genitourinary: Negative.    Musculoskeletal: Positive for joint pain.   Skin: Negative.    Neurological: Negative.    Endo/Heme/Allergies: Negative.    Psychiatric/Behavioral: Negative.      Medications     Current Outpatient Prescriptions   Medication Sig    sulfaSALAzine (AZULFIDINE) 500 MG tablet Take 500 mg by mouth 2 times daily    metformin (GLUCOPHAGE-XR) 500 MG 24 hr tablet Take 1,000 mg by mouth 2 times daily       glimepiride (AMARYL) 2 MG tablet Take 4 mg by mouth 2 times daily       aspirin 81 MG EC tablet Take 81 mg by mouth every morning.    lisinopril (PRINIVIL,ZESTRIL) 5 MG tablet Take 5 mg by mouth daily.    lovastatin (MEVACOR) 40 MG tablet Take 40 mg by mouth nightly.     Vitals and Physical Exam     [name]'s  height is 1.778 m (5\' 10" ) and weight is 67.6 kg (149 lb). His blood pressure is 160/90 and his pulse is 52.  Body mass index is 21.38  kg/(m^2).    Physical Exam   Constitutional: He is oriented to person, place, and time. He appears well-developed and well-nourished. No distress.   HENT:   Head: Normocephalic and atraumatic.   Eyes: Pupils are equal, round, and reactive to light. No scleral icterus.   Neck: Normal range of motion. Neck supple. No JVD present. Carotid bruit is not present. No thyromegaly present.   Cardiovascular: Normal rate, regular rhythm and intact distal pulses.  Exam reveals no gallop and no friction rub.    Murmur heard.  3/6 systolic murmur   Pulmonary/Chest: Effort normal and breath sounds normal. No respiratory distress. He has no wheezes. He has no rales.   Abdominal: Soft. Bowel sounds are normal. There is no tenderness.   Musculoskeletal: He exhibits no edema or tenderness.   Neurological: He is alert and oriented to person, place, and time.   Skin: Skin is warm and dry. No rash noted. He is not diaphoretic. No erythema. No pallor.   Psychiatric: He has a normal mood and affect.  His behavior is normal. Judgment and thought content normal.   Vitals reviewed.    Laboratory Data     [facility] reviewed from this month.    Cardiac/Imaging Data     ECG: NSR.         Exercise Stress Echo Complete 11/03/2015    Narrative Normal LV systolic function.  No significant valvular heart disease.  No echocardiographic evidence of ischemia.  No exercise-induced chest pain.                   Impression and Plan     Patient Active Problem List   Diagnosis Code    Unspecified cataract H26.9    HTN (hypertension) I10    Hyperlipidemia E78.5    Coronary heart disease I25.10       This is an 83 y.o. male with history as stated above.    #1.  Coronary artery disease status post bypass 4 13 years ago.  Negative stress testing a year ago.  No testing required at this time.  Continue current medications and lifestyle modification.    #2.  Hypertension.  Blood pressures been elevated at times.  Would increase lisinopril  pressures remain elevated.    #3.  Hyperlipidemia.  Patient does not want to change statins.  Consider high-dose statin in the future.    Return to practice as needed.  Moving to [place].  All questions answered.       Madelaine BhatEDUARDO A Karen Huhta, MD  Electronically signed on 11/20/2016 at 1:53 PM.

## 2016-11-25 LAB — EKG 12-LEAD
P: 40 deg
PR: 212 ms
QRS: 64 deg
QRSD: 88 ms
QT: 408 ms
QTc: 376 ms
Rate: 51 {beats}/min
Severity: ABNORMAL
Statement: ABNORMAL
T: 54 deg

## 2017-01-28 ENCOUNTER — Encounter: Payer: Self-pay | Admitting: Gastroenterology

## 2017-02-24 ENCOUNTER — Ambulatory Visit: Payer: Medicare (Managed Care) | Attending: Ophthalmology | Admitting: Ophthalmology

## 2017-02-24 ENCOUNTER — Encounter: Payer: Self-pay | Admitting: Ophthalmology

## 2017-02-24 DIAGNOSIS — Z961 Presence of intraocular lens: Secondary | ICD-10-CM

## 2017-02-24 DIAGNOSIS — H2511 Age-related nuclear cataract, right eye: Secondary | ICD-10-CM

## 2017-02-24 DIAGNOSIS — H25011 Cortical age-related cataract, right eye: Secondary | ICD-10-CM

## 2017-02-24 MED ORDER — MOXIFLOXACIN HCL 0.5 % OP SOLN *I*
1.0000 [drp] | Freq: Four times a day (QID) | OPHTHALMIC | 1 refills | Status: AC
Start: 2017-02-28 — End: ?

## 2017-02-24 MED ORDER — PREDNISOLONE ACETATE 1 % OP SUSP *I*
1.0000 [drp] | Freq: Four times a day (QID) | OPHTHALMIC | 5 refills | Status: AC
Start: 2017-02-28 — End: ?

## 2017-02-24 MED ORDER — KETOROLAC TROMETHAMINE 0.5 % OP SOLN *I*
1.0000 [drp] | Freq: Four times a day (QID) | OPHTHALMIC | 5 refills | Status: DC
Start: 2017-02-28 — End: 2017-06-06

## 2017-02-24 NOTE — Progress Notes (Signed)
Subjective:   Subjective   Alejandro Stephenson 02/24/2017   [birth year] 83 y.o. male Referring Dr./Source existing patient   Chief Complaint   Patient presents with    Blurred Vision         Past Medical History:   Diagnosis Date    Coronary heart disease     s/p CABG    Diabetes mellitus     Hyperlipidemia     Hypertension     Psoriatic arthritis     Trigger finger     Unspecified cataract 10/09/2009      Past Surgical History:   Procedure Laterality Date    APPENDECTOMY      CHOLECYSTECTOMY      CORONARY ARTERY BYPASS GRAFT  2005    LIMA to LAD, SVG diag, OM and PDA separate.    UMBILICAL HERNIA REPAIR        History   Smoking Status    Never Smoker   Smokeless Tobacco    Never Used      History   Alcohol Use    4.2 oz/week    2 Glasses of wine, 5 Cans of beer per week      History   Drug Use No        Current Outpatient Prescriptions:     JARDIANCE 10 MG tablet, , Disp: , Rfl: 0    sulfaSALAzine (AZULFIDINE) 500 MG tablet, Take 500 mg by mouth 2 times daily, Disp: , Rfl:     metformin (GLUCOPHAGE-XR) 500 MG 24 hr tablet, Take 1,000 mg by mouth 2 times daily   , Disp: , Rfl:     glimepiride (AMARYL) 2 MG tablet, Take 4 mg by mouth 2 times daily   , Disp: , Rfl:     aspirin 81 MG EC tablet, Take 81 mg by mouth every morning., Disp: , Rfl:     lisinopril (PRINIVIL,ZESTRIL) 5 MG tablet, Take 5 mg by mouth daily., Disp: , Rfl:     lovastatin (MEVACOR) 40 MG tablet, Take 40 mg by mouth nightly., Disp: , Rfl:    Review of patient's allergies indicates no known allergies (drug, envir, food or latex).  Specialty Problems        Ophthalmology Problems    Unspecified cataract               ROS     Positive for: Eyes    Last edited by Mahala Menghini on 02/24/2017 10:17 AM. (History)       Objective:   Objective There were no vitals filed for this visit.      Base Eye Exam     Visual Acuity (Snellen - Linear)      Right Left   Dist sc 20/60 20/20 -2   Dist ph sc 20/30-1          Tonometry (Applanation, 10:40 AM)       Right Left   Pressure 16 14         Pupils      Pupils   Right PERRLA   Left PERRLA         Neuro/Psych     Oriented x3:  Yes    Mood/Affect:  Normal      Dilation     Both eyes:  2.5% Phenylephrine, 1.0% Tropicamide @ 10:40 AM            Additional Tests     Dominant Eye     Dominant Eye:  Right eye  Glare Testing (Transilluminator)      High   Right 20/200   Left                Slit Lamp and Fundus Exam     External Exam      Right Left    External Normal ocular adnexae, lacrimal gland & drainage, orbits Normal ocular adnexae, lacrimal gland & drainage, orbits      Slit Lamp Exam      Right Left    Lids/Lashes Normal structure & position Normal structure & position    Conjunctiva/Sclera Normal bulbar/palpebral, conjunctiva, sclera Normal bulbar/palpebral, conjunctiva, sclera    Cornea Normal epithelium, stroma, endothelium, tear film Normal epithelium, stroma, endothelium, tear film    Anterior Chamber Clear & deep Clear & deep    Iris Normal shape, size, morphology Normal shape, size, morphology    Lens 2+ Nuclear sclerosis, 1+ Cortical cataract Centered PCIOL, anterior capsule fibrosis     Vitreous Clear Asteroid hyalosis      Fundus Exam      Right Left    Disc Normal size, appearance, nerve fiber layer Normal size, appearance, nerve fiber layer    Macula Normal Normal    Vessels Normal Normal    Periphery Normal Normal            Refraction     Wearing Rx      Sphere Cylinder Axis Add   Right +1.25 -1.00 104 +2.25   Left +0.75 -0.50 085 +2.25       Type:  PAL      Manifest Refraction      Sphere Cylinder Axis Dist   Right +2.00 -1.00 102 20/25-1   Left +0.75 -0.25 167 20/20                           No annotated images are attached to the encounter.         Assessment:   2+ NSC 1+ cortical OD with decreased ADL's  S/p CE OS with anterior capsule fibrosis. Stable vision would observe for now  Asteroid hyalosis OS     Plan:   Recommend CE OD with Crystalens, myopic target due to hyperopia OS  RBA's  reviewed, IC done   Patient accepts and wishes to proceed with CE OD   IOL Options / Benefits and Risks incl. Loss BCVA, Under/over-correction, presbyopia, reading glasses, adaptation to [place], CME, glaucoma, RD, PCO, capsule tear, severe infection, bleeding, severe vision loss.

## 2017-02-24 NOTE — Addendum Note (Signed)
Addended by: Presley Raddle on: 02/24/2017 01:39 PM     Modules accepted: Orders

## 2017-02-24 NOTE — Progress Notes (Signed)
Subjective:       Alejandro Stephenson is a 83 y.o. male who presents for evaluation of cataract OD.   Review of Systems  A comprehensive review of systems was negative.      Objective:     BP 175/93, pluse 73    General:  alert   Skin:  normal   Eyes: conjunctivae/corneas clear. PERRL, EOM's intact. Fundi benign.       Lymph Nodes:  Cervical, supraclavicular, and axillary nodes normal.   Lungs:  clear to auscultation bilaterally   Heart:  regular rate and rhythm, S1, S2 normal, no murmur, click, rub or gallop       CVA:  absent   Genitourinary: defer exam   Extremities:  extremities normal, atraumatic, no cyanosis or edema   Neurologic:  Alert and oriented x3. Gait normal. Reflexes and motor strength normal and symmetric. Cranial nerves 2-12 and sensation grossly intact.          Assessment:       Physical clear.        Plan:     OK to proceed with CE OD as scheduled.

## 2017-02-28 NOTE — Anesthesia Preprocedure Evaluation (Addendum)
Anesthesia Pre-operative History and Physical for Alejandro Stephenson    Highlighted Issues for this Procedure:  83 y.o. male with Cataract (H26.9) presenting for Procedure(s):  PHACO w/ trulign WITH FEMTOSECOND LASER by Surgeon(s):  Cathi Roan, MD scheduled for 75 minutes.    PMHx:  CAD - CABG 2005, stress test 2016 (WNL) and EKG 2018 with possible old anterioseptal infarct. Last saw cards in June 18, where was noted to be stable. Suggested increasing ACE, pt didn't want to. On ASA, statin.   HTN - ACE and not terribly well controlled - today was 180s/90, which he says is highe than usual, but he doesn't really know what his usual is.   DM - on orals only. Doesn't know last A1c      Had other eye done in 2011 - did well with MAC.         Marland Kitchen  Anesthesia Evaluation Information Source: patient, records     ANESTHESIA HISTORY  Pertinent(-):  No History of anesthetic complications or Family hx of anesthetic complications    GENERAL  Pertinent (-):  No obesity    HEENT    + Visual Impairment  Pertinent (-):  No anatomic issues or neck pain PULMONARY  Pertinent(-):  No smoking, shortness of breath or COPD    CARDIOVASCULAR  Good(4+METs) Exercise Tolerance    + Hypertension          poorly controlled    + Cardiac Testing          treadmill, normal function    + CABG    + CAD          asymptomatic  Pertinent(-):  No angina    GI/HEPATIC/RENAL  Last PO Intake: >8hr before procedure and >2hr before procedure (clears) NEURO/PSYCH  Pertinent(-):  No chronic pain, seizures or cerebrovascular event    ENDO/OTHER    + Diabetes Mellitus          Type 2 no insulin    HEMATOLOGIC    + Anticoagulants/Antiplatelets          ASA    + Blood dyscrasia          hyperlipidemia       Physical Exam    Airway            Mouth opening: normal            Mallampati: II            TM distance (fb): >3 FB            TM distance (cm): 4            Neck ROM: full  Dental   Normal Exam   Cardiovascular           Rhythm: regular           Rate:  normal    + Murmur  1/6 and systolic      Neurologic      Current Pain Score: 0    General Survey    No rashes   Pulmonary     breath sounds clear to auscultation    No cough, rhonchi, decreased breath sounds, stridor    Mental Status     oriented to person, place and time         ________________________________________________________________________  PLAN  ASA Score  2  Anesthetic Plan MAC     Induction (routine IV) General Anesthesia/Sedation Maintenance Plan (IV bolus); Airway (nasal cannula); Line (  use current access); Monitoring (standard ASA); Positioning (supine); PONV Plan (ondansetron); Pain (per surgical team); PostOp (ASC)    Informed Consent     Risks:          Risks discussed were commensurate with the plan listed above with the following specific points: N/V, aspiration, sore throat, hypotension, headache, failed block and infection , damage to:(eyes, blood vessels, teeth, nerves), allergic Rx, unexpected serious injury, awareness    Anesthetic Consent:         Anesthetic plan (and risks as noted above) were discussed with patient    Plan also discussed with team members including:       CRNA and surgeon    Attending Attestation:  As the primary attending anesthesiologist, I attest that the patient or proxy understands and accepts the risks and benefits of the anesthesia plan. I also attest that I have personally performed a pre-anesthetic examination and evaluation, and prescribed the anesthetic plan for this particular location within 48 hours prior to the anesthetic as documented. Tawnya Crook, MD,PHD 11:30 AM

## 2017-02-28 NOTE — Discharge Instructions (Signed)
Post Operative Instructions for Cataract Surgery    Name: Alejandro Stephenson  DOB: [birth year]     Age: 83 y.o.  MRN: [id number]    Date: 02/28/2017    Day of Surgery:    Continue Vigamox, Pred, and Ketorolac drops 2-3 more times today in the operative eye.      Dr. Jilda Roche has placed a protective shield over your eye. Do not rub or touch your eye. If tape should become loosened, you may reinforce the shield with the tape provided in the post op kit. Keep the area clean and dry and do not shower until after we've seen you for your 1 day postoperative appointment.    It is normal to feel some discomfort, tearing, aching and foreign body sensation in your eye. It is recommended that you rest quietly with your head slightly elevated for increased comfort. You may take your preferred over the counter analgesic (Motrin, Aleve, Tylenol, etc) as needed. A stronger pain medication can be prescribed if necessary.    If you have any problems or concerns Dr. Jilda Roche on his cell: [phone] or home [phone]. If you cannot get in touch with him, please call our office at  [phone] and have the page operator contact our medical staff.     For patients who are recovering from the first eye: A soft contact lens can be worn in the companion eye beginning day 1 to provide best functional vision while waiting for surgery on the fellow eye.    Please follow up tomorrow Tuesday 03/04/17 @ 9:40 am at the following address:    [facility],  [address], [place] [address],  [place], [place] [address]    Please bring your post-op kit with you, including all eye drops, so that we can review your post-operative instructions, Thank you.        Authored by Cathi Roan, MD on 02/28/2017 at 9:25 AM

## 2017-03-03 ENCOUNTER — Ambulatory Visit: Payer: Medicare (Managed Care) | Admitting: Anesthesiology

## 2017-03-03 ENCOUNTER — Encounter: Payer: Self-pay | Admitting: Ophthalmology

## 2017-03-03 ENCOUNTER — Ambulatory Visit
Admission: RE | Admit: 2017-03-03 | Discharge: 2017-03-03 | Disposition: A | Discharge status: Home / Self Care | Payer: Medicare (Managed Care) | Source: Ambulatory Visit | Attending: Ophthalmology | Admitting: Ophthalmology

## 2017-03-03 ENCOUNTER — Encounter
Admission: RE | Disposition: A | Discharge status: Home / Self Care | Payer: Self-pay | Source: Ambulatory Visit | Attending: Ophthalmology

## 2017-03-03 ENCOUNTER — Ambulatory Visit: Payer: Medicare (Managed Care) | Attending: Ophthalmology | Admitting: Ophthalmology

## 2017-03-03 DIAGNOSIS — Z951 Presence of aortocoronary bypass graft: Secondary | ICD-10-CM | POA: Insufficient documentation

## 2017-03-03 DIAGNOSIS — E119 Type 2 diabetes mellitus without complications: Secondary | ICD-10-CM | POA: Insufficient documentation

## 2017-03-03 DIAGNOSIS — H269 Unspecified cataract: Secondary | ICD-10-CM

## 2017-03-03 DIAGNOSIS — H2511 Age-related nuclear cataract, right eye: Secondary | ICD-10-CM

## 2017-03-03 DIAGNOSIS — H25011 Cortical age-related cataract, right eye: Secondary | ICD-10-CM

## 2017-03-03 DIAGNOSIS — I10 Essential (primary) hypertension: Secondary | ICD-10-CM | POA: Insufficient documentation

## 2017-03-03 DIAGNOSIS — Z7982 Long term (current) use of aspirin: Secondary | ICD-10-CM | POA: Insufficient documentation

## 2017-03-03 HISTORY — PX: PR XCAPSL CTRC RMVL INSJ IO LENS PROSTH CPLX WO ECP: 66982

## 2017-03-03 LAB — POCT GLUCOSE: Glucose POCT: 128 mg/dL — ABNORMAL HIGH (ref 60–99)

## 2017-03-03 SURGERY — EXTRACTION, CATARACT, USING PHACOEMULSIFICATION AND FEMTOSECOND LASER
Anesthesia: Monitor Anesthesia Care | Site: Eye | Laterality: Right | Wound class: Clean

## 2017-03-03 MED ORDER — MOXIFLOXACIN HCL 0.5 % OP SOLN *I*
OPHTHALMIC | Status: AC
Start: 2017-03-03 — End: 2017-03-03
  Filled 2017-03-03: qty 3

## 2017-03-03 MED ORDER — CYCLOPENTOLATE HCL 2 % OP SOLN *I*
OPHTHALMIC | Status: AC
Start: 2017-03-03 — End: 2017-03-03
  Filled 2017-03-03: qty 5

## 2017-03-03 MED ORDER — MOXIFLOXACIN 0.5 MG/0.1 ML INTRACAMERAL INJECTION *I*
INTRACAMERAL | Status: DC | PRN
Start: 2017-03-03 — End: 2017-03-03
  Administered 2017-03-03: 0.5 mg via INTRACAMERAL

## 2017-03-03 MED ORDER — EPINEPHRINE 1 MG/ML IJ SOLUTION WRAPPED *I*
INTRAOCULAR | Status: DC | PRN
Start: 2017-03-03 — End: 2017-03-03
  Administered 2017-03-03: 480 mL via INTRAOCULAR

## 2017-03-03 MED ORDER — PHENYLEPHRINE-KETOROLAC 1-0.3 % IO SOLN *OR USE ONLY*
INTRAOCULAR | Status: DC | PRN
Start: 2017-03-03 — End: 2017-03-03
  Administered 2017-03-03: 4 mL via INTRAOCULAR

## 2017-03-03 MED ORDER — FENTANYL CITRATE 50 MCG/ML IJ SOLN *WRAPPED*
INTRAMUSCULAR | Status: AC
Start: 2017-03-03 — End: 2017-03-03
  Filled 2017-03-03: qty 2

## 2017-03-03 MED ORDER — LIDOCAINE HCL 1 % IJ SOLN *I*
INTRAMUSCULAR | Status: DC | PRN
Start: 2017-03-03 — End: 2017-03-03
  Administered 2017-03-03: 1 mL via INTRAOCULAR

## 2017-03-03 MED ORDER — PHENYLEPHRINE HCL 10 % OP SOLN *I*
OPHTHALMIC | Status: AC
Start: 2017-03-03 — End: 2017-03-03
  Filled 2017-03-03: qty 5

## 2017-03-03 MED ORDER — HYPROMELLOSE 2.5 % OP SOLN *WRAPPED*
Status: DC | PRN
Start: 2017-03-03 — End: 2017-03-03
  Administered 2017-03-03: 1 [drp] via OPHTHALMIC

## 2017-03-03 MED ORDER — TETRACAINE HCL 0.5 % OP SOLN *I*
OPHTHALMIC | Status: AC
Start: 2017-03-03 — End: 2017-03-03
  Filled 2017-03-03: qty 2

## 2017-03-03 MED ORDER — SODIUM CHLORIDE 0.9 % IV SOLN WRAPPED *I*
20.0000 mL/h | Status: DC
Start: 2017-03-03 — End: 2017-03-03

## 2017-03-03 MED ORDER — MOXIFLOXACIN HCL 0.5 % OP SOLN *I*
1.0000 [drp] | Freq: Once | OPHTHALMIC | Status: AC
Start: 2017-03-03 — End: 2017-03-03
  Administered 2017-03-03: 1 [drp] via OPHTHALMIC
  Filled 2017-03-03: qty 3

## 2017-03-03 MED ORDER — POVIDONE-IODINE 5 % OP SOLN *I*
OPHTHALMIC | Status: DC | PRN
Start: 2017-03-03 — End: 2017-03-03
  Administered 2017-03-03: 30 mL via TOPICAL

## 2017-03-03 MED ORDER — HYPROMELLOSE 2.5 % OP SOLN *WRAPPED*
Status: AC
Start: 2017-03-03 — End: 2017-03-03
  Filled 2017-03-03: qty 15

## 2017-03-03 MED ORDER — LIDOCAINE HCL (PF) 1 % IJ SOLN *I*
INTRAMUSCULAR | Status: AC
Start: 2017-03-03 — End: 2017-03-03
  Filled 2017-03-03: qty 5

## 2017-03-03 MED ORDER — APRACLONIDINE HCL 1 % OP SOLN *I*
OPHTHALMIC | Status: DC | PRN
Start: 2017-03-03 — End: 2017-03-03
  Administered 2017-03-03: 1 [drp] via TOPICAL

## 2017-03-03 MED ORDER — MIDAZOLAM HCL 1 MG/ML IJ SOLN *I* WRAPPED
INTRAMUSCULAR | Status: DC | PRN
Start: 2017-03-03 — End: 2017-03-03
  Administered 2017-03-03: 1 mg via INTRAVENOUS

## 2017-03-03 MED ORDER — TETRACAINE HCL 0.5 % OP SOLN *I*
1.0000 [drp] | OPHTHALMIC | Status: DC
Start: 2017-03-03 — End: 2017-03-03
  Administered 2017-03-03: 1 [drp] via OPHTHALMIC
  Filled 2017-03-03: qty 2

## 2017-03-03 MED ORDER — MIDAZOLAM HCL 1 MG/ML IJ SOLN *I* WRAPPED
INTRAMUSCULAR | Status: AC
Start: 2017-03-03 — End: 2017-03-03
  Filled 2017-03-03: qty 2

## 2017-03-03 MED ORDER — MOXIFLOXACIN HCL 0.5 % OP SOLN *I*
OPHTHALMIC | Status: DC | PRN
Start: 2017-03-03 — End: 2017-03-03
  Administered 2017-03-03: 2 [drp] via TOPICAL

## 2017-03-03 MED ORDER — PHENYLEPHRINE HCL 10 % OP SOLN *I*
1.0000 [drp] | OPHTHALMIC | Status: DC
Start: 2017-03-03 — End: 2017-03-03
  Administered 2017-03-03 (×2): 1 [drp] via OPHTHALMIC
  Filled 2017-03-03: qty 5

## 2017-03-03 MED ORDER — LIDOCAINE HCL 1 % IJ SOLN *I*
0.1000 mL | INTRAMUSCULAR | Status: DC | PRN
Start: 2017-03-03 — End: 2017-03-03
  Filled 2017-03-03: qty 2

## 2017-03-03 MED ORDER — FLURBIPROFEN SODIUM 0.03 % OP SOLN *I*
1.0000 [drp] | Freq: Once | OPHTHALMIC | Status: AC
Start: 2017-03-03 — End: 2017-03-03
  Administered 2017-03-03: 1 [drp] via OPHTHALMIC
  Filled 2017-03-03: qty 2.5

## 2017-03-03 MED ORDER — POVIDONE-IODINE 5 % OP SOLN *I*
OPHTHALMIC | Status: AC
Start: 2017-03-03 — End: 2017-03-03
  Filled 2017-03-03: qty 30

## 2017-03-03 MED ORDER — APRACLONIDINE HCL 1 % OP SOLN *I*
OPHTHALMIC | Status: AC
Start: 2017-03-03 — End: 2017-03-03
  Filled 2017-03-03: qty 0.1

## 2017-03-03 MED ORDER — CYCLOPENTOLATE HCL 2 % OP SOLN *I*
1.0000 [drp] | OPHTHALMIC | Status: DC
Start: 2017-03-03 — End: 2017-03-03
  Administered 2017-03-03 (×2): 1 [drp] via OPHTHALMIC
  Filled 2017-03-03: qty 5

## 2017-03-03 MED ORDER — TIMOLOL MALEATE 0.5 % OP SOLN *I*
OPHTHALMIC | Status: AC
Start: 2017-03-03 — End: 2017-03-03
  Filled 2017-03-03: qty 5

## 2017-03-03 MED ORDER — FENTANYL CITRATE 50 MCG/ML IJ SOLN *WRAPPED*
INTRAMUSCULAR | Status: DC | PRN
Start: 2017-03-03 — End: 2017-03-03
  Administered 2017-03-03: 50 ug via INTRAVENOUS

## 2017-03-03 MED ORDER — EPINEPHRINE PF/SF 1 MG/ML IJ SOLN *I*
INTRAMUSCULAR | Status: AC
Start: 2017-03-03 — End: 2017-03-03
  Filled 2017-03-03: qty 1

## 2017-03-03 MED ORDER — FLURBIPROFEN SODIUM 0.03 % OP SOLN *I*
OPHTHALMIC | Status: AC
Start: 2017-03-03 — End: 2017-03-03
  Filled 2017-03-03: qty 2.5

## 2017-03-03 MED ORDER — ACETAMINOPHEN 325 MG PO TABS *I*
650.0000 mg | ORAL_TABLET | Freq: Once | ORAL | Status: DC
Start: 2017-03-03 — End: 2017-03-03

## 2017-03-03 MED ORDER — LACTATED RINGERS IV SOLN *I*
20.0000 mL/h | INTRAVENOUS | Status: DC
Start: 2017-03-03 — End: 2017-03-03
  Administered 2017-03-03: 20 mL/h via INTRAVENOUS

## 2017-03-03 MED ORDER — MOXIFLOXACIN 0.5 MG/0.1 ML INTRACAMERAL INJECTION *I*
INTRACAMERAL | Status: AC
Start: 2017-03-03 — End: 2017-03-03
  Filled 2017-03-03: qty 0.3

## 2017-03-03 MED ORDER — TIMOLOL MALEATE 0.5 % OP SOLN *I*
OPHTHALMIC | Status: DC | PRN
Start: 2017-03-03 — End: 2017-03-03
  Administered 2017-03-03: 1 [drp] via TOPICAL

## 2017-03-03 SURGICAL SUPPLY — 24 items
BAG BSS CENTURION 500ML (Drug) ×3 IMPLANT
CANNULA VISCOFLOW 27G ANGL (Supply) ×6 IMPLANT
COVER MAYO STAND (Drape) ×2
COVER MAYO STD W24XL53IN 3 LAYR SMS RECOIL TECH DISP (Drape) ×1 IMPLANT
DRAPE INSTRUMENT CLEAR ADHESIVE (Drape) ×3 IMPLANT
DRAPE SURG INCISE XL 51 X 51IN STER (Drape) ×3 IMPLANT
GLOVE BIOGEL PI MICRO SZ 6.5 (Glove) ×3 IMPLANT
KNIFE ANGLED SLIT 2.8MM DISCONTINUED (Other) ×2 IMPLANT
KNIFE MVR EDGE AHEAD (Supply) ×3 IMPLANT
KNIFE SAFETY SLIT XSTAR 2.2MM (Supply) ×3 IMPLANT
LENS INTRAOCULAR TRULIGN TORIC (Implant) ×1 IMPLANT
LENS TRULIGN (Implant) ×1 IMPLANT
PACK CENTUR FMS ULTRA BAL 45DEG BEVEL UP REPLACED WITH 299597 (Other) ×3
PACK CUSTOM EYE PACK (Pack) ×3 IMPLANT
PACK LIQUID OPTIC INTERFACE BULK STER (Other) ×2 IMPLANT
RING MCT EYEJET 14C MORCHER (Implant) ×2 IMPLANT
SEALANT LUBRICATING IORT MGMT OF CLR CORNEAL INCISIONS RESURE (Other) ×3 IMPLANT
SET DISPOSABLE BIMANUAL POLYMER I/A (Other) ×3 IMPLANT
SOL PROVISC OPTHALMIC 1PCT .85ML (Drug) ×3 IMPLANT
SOL VISCOAT .75ML (Supply) ×2
SOLUTION VISCOELASTIC 0.75ML 40-30MG/ML SOD CHONDROITIN SULF SOD HYALURONATE IO PREFIL SYR (Supply) ×1 IMPLANT
SYRINGE NEEDLE LUERLOCK 3CC 22G X 1.5IN (Supply) ×3 IMPLANT
SYRINGE TB LUERLOCK NO NEEDLE 1CC (Supply) ×3 IMPLANT
SYSTEM FLD MGMT DIA0.9MM 45DEG ULT BAL VISN ACT INTREPID CENTURION (Other) ×1 IMPLANT

## 2017-03-03 NOTE — Anesthesia Case Conclusion (Signed)
CASE CONCLUSION  Emergence  Assessment:  Routine  Transport  Directly to: ASC  Patient Condition on Handoff  Level of Consciousness:  Alert/talking/calm  Patient Condition:  Stable  Handoff Report to:  RN

## 2017-03-03 NOTE — Anesthesia Postprocedure Evaluation (Signed)
Anesthesia Post-Op Note    Patient: Alejandro Stephenson    Procedure(s) Performed:  Procedure Summary  Date:  03/03/2017 Anesthesia Start: 03/03/2017 10:03 AM Anesthesia Stop: 03/03/2017 10:49 AM Room / Location:  [id number] / [facility] MAIN OR   Procedure(s):  PHACO w/ trulign WITH FEMTOSECOND LASER Diagnosis:  Cataract [H26.9] Surgeon(s):  Presley Raddle, MD  Janese Banks, MD Attending Anesthesiologist:  Beverlyn Roux, MD,PhD         Recovery Vitals  BP: 172/79 (03/03/2017 11:04 AM)  Heart Rate: 60 (03/03/2017  8:15 AM)  Heart Rate (via Pulse Ox): 49 (03/03/2017 11:04 AM)  Resp: 16 (03/03/2017 11:00 AM)  Temp: 36 C (96.8 F) (03/03/2017 11:00 AM)  SpO2: 100 % (03/03/2017 11:04 AM)   0-10 Scale: 0 (03/03/2017 11:00 AM)  Anesthesia type:  Monitor Anesthesia Care  Complications Noted During Procedure or in PACU:  None   Comment:    Patient Location:  PACU  Level of Consciousness:    Recovered to baseline, awake, oriented and alert  Patient Participation:     Able to participate  Temperature Status:    Normothermic  Oxygen Saturation:    Within patient's normal range  Cardiac Status:   Within patient's normal range  Fluid Status:    Stable  Airway Patency:     Yes  Pulmonary Status:    Baseline  Pain Management:    Adequate analgesia  Nausea and Vomiting:  None    Post Op Assessment:    Tolerated procedure well   Attending Attestation:  All indicated post anesthesia care provided     -

## 2017-03-03 NOTE — OR Nursing (Signed)
PT Seen, Id'd, and Signed Off in Pre-an     Cyndie Chime, RN

## 2017-03-03 NOTE — Anesthesia Procedure Notes (Signed)
---------------------------------------------------------------------------------------------------------------------------------------    AIRWAY   GENERAL INFORMATION AND STAFF    Patient location during procedure: OR  FINAL AIRWAY DETAILS    Final Airway Type:  Nasal cannula    Head position required to avoid obstruction:  Neutral    Insertion Site:  Left naris and right naris  ----------------------------------------------------------------------------------------------------------------------------------------

## 2017-03-03 NOTE — Op Note (Signed)
Operative Note (Surgical Log ID: [id number])       Date of Surgery: 03/03/2017       Surgeons: Surgeon(s) and Role:     * Presley Raddle, MD - Primary     * Janese Banks, MD - Fellow       Pre-op Diagnosis: Pre-Op Diagnosis Codes:     * Cataract [H26.9]       Post-op Diagnosis: Post-Op Diagnosis Codes:     * Cataract [H26.9]       Procedure(s) Performed: Procedure:    PHACO w/ trulign WITH FEMTOSECOND LASER  CPT(R) Code:  95621 - PR REMV CATARACT EXTRACAP,INSERT LENS,COMP         Additional CPT Codes:        Anesthesia Type: Monitor Anesthesia Care        Fluid Totals: I/O this shift:  10/08 0700 - 10/08 1459  In: 600 (9.5 mL/kg) [I.V.:600]  Out: 0 (0 mL/kg)   Net: 600  Weight: 62.9 kg        Estimated Blood Loss: Blood Loss: 0 mL       Specimens to Pathology:  * No specimens in log *       Implants:    Implants     Type Name Action Serial No.      Implant RING MCT EYEJET 14C MORCHER - [id number] Implanted [id number]     Implant LENS TRULIGN - [id number] Implanted [id number]                Packing:                 Patient Condition: good       Indications: Blurry vision       Findings (Including unexpected complications): Cataract right eye     Description of Procedure:   On the days before surgery the patient was instructed to take a prophylactic vigamox antibiotic 4x/d, a non-steroid drop 3x/d and Prednisone Acetate 1% 4x/d for a minimum of three days before surgery, and 7 days for diabetics or patients at risk for CME.  Prior to the day of surgery, the risks, benefits, and alternatives of cataract surgery were discussed, including the risks and benefits and written informed consent was obtained.     On the day of surgery, the patient was examined in the pre-operative staging area, questions were answered, and the correct operative eye was marked. The eye was anesthestized with tetracaine drops and the pupil dilated with 2.5% Neo synephrine and 1% Mydriacyl x3.    The patient was escorted to the femtosecond laser  suite. A time out patient eye and procedure verification was performed. Tetracaine drops were given.  The eye was docked with the femtosecond laser, and a capsulotomy and lens fragmentation were performed. One drop of tetracaine followed by a single drop of 5% betadine were placed in the operative eye.      The patient was then transported to the operating room and was placed in the seated upright position.  The limbus was marked at the 0 and 90 degree axes and then the patient was placed in a supine postion.  time out confirmed that the correct eye was being operated on.  The lids and lashes were prepped with 5% Betadine solution and draped in a sterile ophthalmic fashion.     Using a Mendez ring, the 006 degree axis was marked.  A sideport incision was made and non-preserved intracameral lidocaine was injected into the eye, followed  by viscoelastic to expand the anterior chamber.The Capsulotomy was gently depressed with irrigating cannula to ensure complete capsule separation 360 degrees. A temporal incision was made with a keratome. The residual capsulotomy flap was removed with micro-Utrata forceps. Gentle hydrodissection was performedwith BSS and the nucleus was rotated. The phacoemulsification handpiece was inserted into the eye and the lens fragments were separated and removed using a divide and conquer technique. The cortex was then removed with bimanual irrigation and aspiration. The capsular bag was filled with viscoelastic and the lens implant was injected into the posterior capsular bag and rotated to ensure good positioning after the placement of a capsular tension ring. The I/A hand piece was used to remove residual viscoelastic. The lens implant was then aligned to the correct axis mark noted above. Moxifloxacin 0.5mg /0.47ml was injected intracamerally and intrastromal.The wounds were hydrated and found to be water tight. A drop of Vigamox, Timoptic and Iopodine were placed on the ocular surface. The  eye was shielded and the patient was escorted to the recovery room in good condition.    Signed:  Janese Banks, MD  on 03/03/2017 at 10:52 AM

## 2017-03-03 NOTE — Preop H&P (Signed)
UPDATES TO PATIENT'S CONDITION on the DAY OF SURGERY/PROCEDURE    I. Updates to Patient's Condition (to be completed by a provider privileged to complete a H&P, following reassessment of the patient by the provider):    Day of Surgery/Procedure Update:  History  History reviewed and no change    Physical  Physical exam updated and no change            II. Procedure Readiness   I have reviewed the patient's H&P and updated condition. By completing and signing this form, I attest that this patient is ready for surgery/procedure.    III. Attestation   I have reviewed the updated information regarding the patient's condition and it is appropriate to proceed with the planned surgery/procedure.    Presley Raddle, MD as of 7:31 AM 03/03/2017

## 2017-03-04 ENCOUNTER — Ambulatory Visit: Payer: Medicare (Managed Care) | Attending: Ophthalmology | Admitting: Ophthalmology

## 2017-03-04 ENCOUNTER — Encounter: Payer: Self-pay | Admitting: Ophthalmology

## 2017-03-04 DIAGNOSIS — Z9841 Cataract extraction status, right eye: Secondary | ICD-10-CM

## 2017-03-04 DIAGNOSIS — Z961 Presence of intraocular lens: Secondary | ICD-10-CM

## 2017-03-04 NOTE — Progress Notes (Signed)
Subjective:   Subjective   Alejandro Stephenson 03/04/2017   Surgery Date:OD: 03/03/17, IOL: Phillis Haggis, OS: 10/16/09 and IOL: [place]  Visit: 1 day  Chief Complaint   Patient presents with    Post Cataract Surgery         Past Medical History:   Diagnosis Date    Coronary heart disease     s/p CABG    Diabetes mellitus     Hyperlipidemia     Hypertension     Psoriatic arthritis     Trigger finger     Unspecified cataract 10/09/2009      Past Surgical History:   Procedure Laterality Date    APPENDECTOMY      CHOLECYSTECTOMY      CORONARY ARTERY BYPASS GRAFT  2005    LIMA to LAD, SVG diag, OM and PDA separate.    UMBILICAL HERNIA REPAIR        History   Smoking Status    Never Smoker   Smokeless Tobacco    Never Used      History   Alcohol Use    4.2 oz/week    2 Glasses of wine, 5 Cans of beer per week      History   Drug Use No        Current Outpatient Prescriptions:     desoximetasone (TOPICORT) 0.25 % cream, Apply to scalp once a day  prn, Disp: , Rfl:     glimepiride (AMARYL) 4 MG tablet, TK 1 T PO BID, Disp: , Rfl: 0    JARDIANCE 10 MG tablet, Take 10 mg by mouth every morning   , Disp: , Rfl: 0    moxifloxacin (VIGAMOX) 0.5 % ophthalmic solution, Place 1 drop into the right eye 4 times daily, Disp: 3 mL, Rfl: 1    prednisoLONE acetate (PRED FORTE) 1 % ophthalmic suspension, Place 1 drop into the right eye 4 times daily, Disp: 5 mL, Rfl: 5    ketorolac (ACULAR) 0.5 % ophthalmic solution, Place 1 drop into the right eye 4 times daily, Disp: 5 mL, Rfl: 5    sulfaSALAzine (AZULFIDINE) 500 MG tablet, Take 500 mg by mouth 2 times daily, Disp: , Rfl:     metformin (GLUCOPHAGE-XR) 500 MG 24 hr tablet, Take 1,000 mg by mouth 2 times daily   , Disp: , Rfl:     aspirin 81 MG EC tablet, Take 81 mg by mouth every morning., Disp: , Rfl:     lisinopril (PRINIVIL,ZESTRIL) 5 MG tablet, Take 5 mg by mouth nightly   , Disp: , Rfl:     lovastatin (MEVACOR) 40 MG tablet, Take 40 mg by mouth nightly., Disp: , Rfl:     Review of patient's allergies indicates no known allergies (drug, envir, food or latex).  Specialty Problems        Ophthalmology Problems    Unspecified cataract                  Objective:   Objective There were no vitals filed for this visit.      Base Eye Exam     Visual Acuity (Snellen - Linear)      Right Left   Dist sc 20/50 -2    Dist ph sc 20/25-2          Tonometry (Applanation, 10:07 AM)      Right Left   Pressure 11          Neuro/Psych  Oriented x3:  Yes    Mood/Affect:  Normal            Slit Lamp and Fundus Exam     External Exam      Right Left    External Normal ocular adnexae, lacrimal gland & drainage, orbits Normal ocular adnexae, lacrimal gland & drainage, orbits      Slit Lamp Exam      Right Left    Lids/Lashes Normal structure & position Normal structure & position    Conjunctiva/Sclera Normal bulbar/palpebral, conjunctiva, sclera Normal bulbar/palpebral, conjunctiva, sclera    Cornea small superior epi-defect Normal epithelium, stroma, endothelium, tear film    Anterior Chamber Clear & deep Clear & deep    Iris Normal shape, size, morphology Normal shape, size, morphology    Lens Centered PCIOL Centered PCIOL, anterior capsule fibrosis     Vitreous Clear Asteroid hyalosis      Fundus Exam      Right Left    Disc Normal size, appearance, nerve fiber layer Normal size, appearance, nerve fiber layer    Macula Normal Normal    Vessels Normal Normal    Periphery Normal Normal                        No annotated images are attached to the encounter.         Assessment:   Doing well 1 day post Cataract extraction OD    Plan:  Monitor 1 week with MR and IOP check  Continue meds:  Vigamox QID for 1 week  Pred acetate QID for 1 week then TID for 1 week then BID for 1 week then once a day for the next three weeks.  Keterolac QID for 6 weeks    RTC ASAP if redness, blurred vision, discomfort occur

## 2017-03-04 NOTE — Patient Instructions (Addendum)
Postoperative instructions were reviewed with the patient.    Cataract / IOL Postoperative Instructions (Day One)    1. Please wear your eye shield at bedtime or when napping for one week.  Use tape to secure the eye shield so it does not shift or slip.    2. Do not rub or bump your eye.    3. Do not swim or use hot tubs or spas for one week following your procedure.    4. Avoid getting water in your eyes during showers or baths for seven (7) days. However, you can keep the area around the eye clean by cleansing gently with a clean wash cloth moistened with warm water.    5. Facial moisturizers can be resumed on the second postoperative day. Eyeliner, eye shadow, mascara should not be used for one week.     6. Avoid activities or environments in which dirt, dust or smoke could get in your eyes for one week.    7. Wear sunglasses as needed for comfort as glare, and light sensitivity may be experienced in your postoperative period.     8. If you experience an increase in discomfort, pain, light sensitivity, a decrease in vision or headache symptoms around the eye brow, contact our office immediately at [phone] and Dr. MacRae or his staff will be paged to contact you or you can contact him directly at home during off hours at [phone] or Dr. Ken Dickerson at [phone].    Postoperative eye drop tapering schedule:   Vigamox  (TAN TOP) Econopred  (PINK/WHITE) Ketorolac  (GRAY TOP) Other   Week 1 4 Times Daily 4 Times Daily 4 Times Daily    Week 2 STOP (03/10/17) 3 Times Daily  (03/10/17) 4 Times Daily    Week 3 ______ 2 Times Daily  (03/17/17) 4 Times Daily    Week 4-6 ______ 1 Time Daily  (03/24/17) -STOP (04/14/17) 4 Times Daily  STOP (04/14/17)      When using multiple drops, always wait 3 minutes between dosages. You may use a lubricating drop as needed for dryness or foreign body sensation.

## 2017-03-10 ENCOUNTER — Ambulatory Visit: Payer: Medicare (Managed Care) | Admitting: Ophthalmology

## 2017-03-10 DIAGNOSIS — H2511 Age-related nuclear cataract, right eye: Secondary | ICD-10-CM

## 2017-03-10 DIAGNOSIS — H25011 Cortical age-related cataract, right eye: Secondary | ICD-10-CM

## 2017-03-10 DIAGNOSIS — H4322 Crystalline deposits in vitreous body, left eye: Secondary | ICD-10-CM

## 2017-03-12 ENCOUNTER — Ambulatory Visit: Payer: Medicare (Managed Care) | Attending: Ophthalmology | Admitting: Ophthalmology

## 2017-03-12 DIAGNOSIS — Z961 Presence of intraocular lens: Secondary | ICD-10-CM

## 2017-03-12 DIAGNOSIS — Z9841 Cataract extraction status, right eye: Secondary | ICD-10-CM

## 2017-03-12 NOTE — Progress Notes (Signed)
Subjective:   Subjective   Alejandro BowerLucian Stephenson 03/12/2017   Surgery Date:OD: 03/03/17 and IOL: [name]  Visit: 1-2 Week  Chief Complaint   Patient presents with    Post Cataract Surgery         Past Medical History:   Diagnosis Date    Coronary heart disease     s/p CABG    Diabetes mellitus     Hyperlipidemia     Hypertension     Psoriatic arthritis     Trigger finger     Unspecified cataract 10/09/2009      Past Surgical History:   Procedure Laterality Date    APPENDECTOMY      CHOLECYSTECTOMY      CORONARY ARTERY BYPASS GRAFT  2005    LIMA to LAD, SVG diag, OM and PDA separate.    PR REMV CATARACT EXTRACAP,INSERT LENS,COMP Right 03/03/2017    Procedure: PHACO w/ trulign WITH FEMTOSECOND LASER;  Surgeon: Presley RaddleMacrae, Jazzmyn Filion M, MD;  [facility];  Service: Ophthalmology    UMBILICAL HERNIA REPAIR        History   Smoking Status    Never Smoker   Smokeless Tobacco    Never Used      History   Alcohol Use    4.2 oz/week    2 Glasses of wine, 5 Cans of beer per week      History   Drug Use No        Current Outpatient Prescriptions:     desoximetasone (TOPICORT) 0.25 % cream, Apply to scalp once a day  prn, Disp: , Rfl:     glimepiride (AMARYL) 4 MG tablet, TK 1 T PO BID, Disp: , Rfl: 0    JARDIANCE 10 MG tablet, Take 10 mg by mouth every morning   , Disp: , Rfl: 0    moxifloxacin (VIGAMOX) 0.5 % ophthalmic solution, Place 1 drop into the right eye 4 times daily, Disp: 3 mL, Rfl: 1    prednisoLONE acetate (PRED FORTE) 1 % ophthalmic suspension, Place 1 drop into the right eye 4 times daily, Disp: 5 mL, Rfl: 5    ketorolac (ACULAR) 0.5 % ophthalmic solution, Place 1 drop into the right eye 4 times daily, Disp: 5 mL, Rfl: 5    sulfaSALAzine (AZULFIDINE) 500 MG tablet, Take 500 mg by mouth 2 times daily, Disp: , Rfl:     metformin (GLUCOPHAGE-XR) 500 MG 24 hr tablet, Take 1,000 mg by mouth 2 times daily   , Disp: , Rfl:     aspirin 81 MG EC tablet, Take 81 mg by mouth every morning., Disp: , Rfl:      lisinopril (PRINIVIL,ZESTRIL) 5 MG tablet, Take 5 mg by mouth nightly   , Disp: , Rfl:     lovastatin (MEVACOR) 40 MG tablet, Take 40 mg by mouth nightly., Disp: , Rfl:    Review of patient's allergies indicates no known allergies (drug, envir, food or latex).  Specialty Problems        Ophthalmology Problems    Unspecified cataract                  Objective:   Objective There were no vitals filed for this visit.      Base Eye Exam     Visual Acuity (Snellen - Linear)      Right Left   Dist sc 20/30 -2    Near sc Unable        20/50 intermediate  Tonometry (Applanation, 1:01 PM)      Right Left   Pressure 18          Neuro/Psych     Oriented x3:  Yes    Mood/Affect:  Normal            Refraction     Manifest Refraction      Sphere Cylinder Dist   Right +0.75 Sphere 20/20   Left          J6 @ near 20/50 intermediate with distance Rx,                         No annotated images are attached to the encounter.         Assessment:   Doing well 1 day post LASIK OU    Plan:  Monitor  Refraction in 1 week  Continue Tobramycin and Pred acetate/ dexamethasone QID OU for five days total  Continue Artifical Tears every 30 min to 1 hour  Continue shield at bedtime for five nights total

## 2017-06-06 ENCOUNTER — Ambulatory Visit: Payer: Medicare (Managed Care) | Attending: Optometry

## 2017-06-06 ENCOUNTER — Encounter: Payer: Self-pay | Admitting: Optometry

## 2017-06-06 ENCOUNTER — Ambulatory Visit: Payer: Medicare (Managed Care) | Attending: Optometry | Admitting: Optometry

## 2017-06-06 DIAGNOSIS — H35351 Cystoid macular degeneration, right eye: Secondary | ICD-10-CM | POA: Insufficient documentation

## 2017-06-06 DIAGNOSIS — Z9841 Cataract extraction status, right eye: Secondary | ICD-10-CM

## 2017-06-06 DIAGNOSIS — Z961 Presence of intraocular lens: Secondary | ICD-10-CM

## 2017-06-06 MED ORDER — KETOROLAC TROMETHAMINE 0.5 % OP SOLN *I*
1.0000 [drp] | Freq: Four times a day (QID) | OPHTHALMIC | 5 refills | Status: AC
Start: 2017-06-06 — End: ?

## 2017-06-06 NOTE — Progress Notes (Signed)
Subjective:   Subjective   Alejandro Stephenson 06/06/2017   Surgery Date:OD: 03/03/17 and IOL: TruHyman Bowerlign  Visit: 3 Months  Chief Complaint   Patient presents with    Post-op         Past Medical History:   Diagnosis Date    Coronary heart disease     s/p CABG    Diabetes mellitus     Hyperlipidemia     Hypertension     Psoriatic arthritis     Trigger finger     Unspecified cataract 10/09/2009      Past Surgical History:   Procedure Laterality Date    APPENDECTOMY      CHOLECYSTECTOMY      CORONARY ARTERY BYPASS GRAFT  2005    LIMA to LAD, SVG diag, OM and PDA separate.    PR REMV CATARACT EXTRACAP,INSERT LENS,COMP Right 03/03/2017    Procedure: PHACO w/ trulign WITH FEMTOSECOND LASER;  Surgeon: Presley RaddleMacrae, Scott M, MD;  [facility];  Service: Ophthalmology    UMBILICAL HERNIA REPAIR        History   Smoking Status    Never Smoker   Smokeless Tobacco    Never Used      History   Alcohol Use    4.2 oz/week    2 Glasses of wine, 5 Cans of beer per week      History   Drug Use No        Current Outpatient Prescriptions:     ketorolac (ACULAR) 0.5 % ophthalmic solution, Place 1 drop into the right eye 4 times daily, Disp: 5 mL, Rfl: 5    desoximetasone (TOPICORT) 0.25 % cream, Apply to scalp once a day  prn, Disp: , Rfl:     glimepiride (AMARYL) 4 MG tablet, TK 1 T PO BID, Disp: , Rfl: 0    JARDIANCE 10 MG tablet, Take 10 mg by mouth every morning   , Disp: , Rfl: 0    moxifloxacin (VIGAMOX) 0.5 % ophthalmic solution, Place 1 drop into the right eye 4 times daily, Disp: 3 mL, Rfl: 1    prednisoLONE acetate (PRED FORTE) 1 % ophthalmic suspension, Place 1 drop into the right eye 4 times daily, Disp: 5 mL, Rfl: 5    sulfaSALAzine (AZULFIDINE) 500 MG tablet, Take 500 mg by mouth 2 times daily, Disp: , Rfl:     metformin (GLUCOPHAGE-XR) 500 MG 24 hr tablet, Take 1,000 mg by mouth 2 times daily   , Disp: , Rfl:     aspirin 81 MG EC tablet, Take 81 mg by mouth every morning., Disp: , Rfl:     lisinopril  (PRINIVIL,ZESTRIL) 5 MG tablet, Take 5 mg by mouth nightly   , Disp: , Rfl:     lovastatin (MEVACOR) 40 MG tablet, Take 40 mg by mouth nightly., Disp: , Rfl:    Patient has no known allergies (drug, envir, food or latex).  Specialty Problems        Ophthalmology Problems    Unspecified cataract               ROS     Positive for: Eyes    Last edited by Mahala Menghiniunn, Ashley on 06/06/2017  8:49 AM. (History)       Objective:   Objective There were no vitals filed for this visit.      Base Eye Exam     Visual Acuity (Snellen - Linear)       Right Left  Dist sc 20/50 -1 20/40 +1          Tonometry (Applanation, 9:00 AM)       Right Left    Pressure 12           Pupils       Pupils    Right PERRLA    Left PERRLA          Neuro/Psych     Oriented x3:  Yes    Mood/Affect:  Normal          Dilation     Both eyes:  1.0% [place] @ 9:02 AM            Slit Lamp and Fundus Exam     External Exam       Right Left    External Normal ocular adnexae, lacrimal gland & drainage, orbits Normal ocular adnexae, lacrimal gland & drainage, orbits          Slit Lamp Exam       Right Left    Lids/Lashes Normal structure & position Normal structure & position    Conjunctiva/Sclera Normal bulbar/palpebral, conjunctiva, sclera Normal bulbar/palpebral, conjunctiva, sclera    Cornea Normal epithelium, stroma, endothelium, tear film Normal epithelium, stroma, endothelium, tear film    Anterior Chamber Clear & deep Clear & deep    Iris Normal shape, size, morphology Normal shape, size, morphology    Lens Centered PCIOL Centered PCIOL, anterior capsule fibrosis     Vitreous Clear Asteroid hyalosis          Fundus Exam       Right Left    Disc Normal size, appearance, nerve fiber layer Normal size, appearance, nerve fiber layer    C/D Ratio 0.2     Macula Cystoid macular edema Normal    Vessels Normal Normal    Periphery Normal Normal            Refraction     Manifest Refraction       Sphere Cylinder Axis Dist VA    Right +0.50 -0.50 167 20/30-1     Left +0.75 Sphere  20/15-2                        No annotated images are attached to the encounter.         Assessment:   S/P PCIOL OD 03/03/17   CME confirmed with OCT 528 CT   S/P PCIOL OS 10/16/2009    Plan:  Started on sample of Brom Site BID OD   Rx sent to [facility] in [place] for Acular QID OD    Refer to Dr. Stefan Church at [facility] for follow-up in about 4 weeks    Pt was to see Dr. Quentin Mulling for final refraction today advised to postpone that exam.

## 2020-02-25 DIAGNOSIS — T8859XA Other complications of anesthesia, initial encounter: Secondary | ICD-10-CM

## 2020-02-25 HISTORY — DX: Other complications of anesthesia, initial encounter: T88.59XA

## 2020-03-28 NOTE — Progress Notes (Addendum)
I, [name], LAT, ATC acting as a scribe for [name], MD.   Subjective:    Chief Complaint: Alexander Harmon,  is a 86 y.o. male who presents for  Injury date : 03/17/20 Visit #: 1   History of Present Illness:  Pt is a 86 yo male who is f/u from a hospitalization. He [place] out of state and is currently staying temporarily with son in [place], [place]. Pt was admitted to the hospital for a fall and needs referrals: PT and speech. Pt has catheter. Pt's son to bring discharge summary. Since being dc, pt reports that he fell on 03/17/20 at [facility] in [place].  He fell when he was walking down some wooden steps from a restaurant and hit the front of his head on the steps and concrete landing below.  He was flown to [facility] in [place] and was admitted for one week.  He was supposed to be admitted to an inpatient rehab hospital but that did not happen because he did not qualify.  He was supposed to get cognitive therapy due to his head injury and they would like to know how to get that situated.  He has been having increased difficulty w/ his hearing and w/ balance/gait.  His cognition is somewhat limited.  They would also like to know how to handle the catheter situation.   Concussion Self-Reported Symptom Score Symptoms rated on a scale 1-6, in last 24 hours   Headache: 0    Nausea: 0  Dizziness: 4  Vomiting: 0  Balance Difficulty: 5   Trouble Falling Asleep: 6   Fatigue: 5  Sleep Less Than Usual: 5  Daytime Drowsiness: 3  Sleep More Than Usual: 4  Photophobia: 2  Phonophobia: 0  Irritability: 1  Sadness: 0  Numbness or Tingling: 0  Nervousness: 0  Feeling More Emotional: 0  Feeling Mentally Foggy: 1  Feeling Slowed Down: 4  Memory Problems: 3  Difficulty Concentrating: 1  Visual Problems: 0   Total # of Symptoms: 13/22 Total Symptom Score: 44/132 Previous Symptom Score: N/A   Neck Pain:No  Tinnitus: No  Review of Systems: No  fevers or chills  Review of History: Diabetes, hypertension further history not noted this time due to unavailability of medical records.  Objective:    Physical Examination Vitals:   03/29/20 1455  BP: 124/64  Pulse: 75  SpO2: 98%   Exam:  BP 124/64 (BP Location: Left Arm, Patient Position: Sitting, Cuff Size: Normal)   Pulse 75   Ht 5\' 7"  (1.702 m)   Wt 158 lb 3.2 oz (71.8 kg)   SpO2 98%   BMI 24.78 kg/m  Wt Readings from Last 5 Encounters:  03/29/20 158 lb 3.2 oz (71.8 kg)    Gen: Well NAD HEENT: EOMI,  MMM Lungs: Normal work of breathing. CTABL Heart: RRR no MRG Abd: NABS, Soft. Nondistended, Nontender.  Indwelling catheter present. Exts: Brisk capillary refill, warm and well perfused.  Neuro: Hard of hearing.  Alert and oriented.  Gait broad-based and slow. Thought processes tangential. Upper extremity strength in lower extremity strength is intact.   Assessment and Plan   86 y.o. male with fall with resulting injury.  Patient suffered a severe injury a few weeks ago and was hospitalized for a week.  Since discharge he has been effectively lost to follow-up with no coordinated medical care.  He has moved to this area to live with his son while he is recovering.  At this time he has not had any referrals to physical therapy or other essential health services.  His primary care provider and [place] has been unable to help effectively.  Time I do not have medical records from the hospitalization however I can infer after talking with the family that he suffered a skull fracture and has new onset urinary retention with indwelling catheter, significant hearing loss thought to be related to skull fracture and head injury and new cognitive and motor neurologic changes.   Obviously all of these issues are not going to be solved today.  Plan to obtain medical records.  Plan for referral to home health physical therapy occupational therapy speech therapy and nursing services  for his essential rehabilitation.  Additionally this will help manage his indwelling Foley catheter.  Plan to referral to urology to help manage indwelling Foley catheter and to further evaluate why he is having urinary retention bad enough that he needs a indwelling Foley catheter.  Plan to refer to neurology for help managing his new neurological symptoms.  Expect this may take a month or 2.  Already has referral to ENT to evaluate hearing loss.  I am concerned he has had injury to his inner ear structures resulting in permanent hearing injury however this will require further evaluation with audiology and ENT.  Currently he has all of his home medications but we will refill them for now.  Either he will improve enough to be able to return home and reestablish care with his PCP or will establish care with new PCP in this area.  The meantime will act as basic PCP services for medication refills.  Recheck in 2 weeks.  At this time I hope to have medical records and a better sense of further needs and what actually happened.   Addendum: Received medical records from [facility].  Received discharge summary. Patient suffered close fracture of the temporal bone, fracture of the occiput, subdural hematoma.  Goals at discharge follow-up with urology as soon as possible.  Evaluate hearing loss thought to be secondary to temporal bone fracture, follow-up with neurosurgery and repeat CT scan head in 4 weeks.      Action/Discussion: Reviewed diagnosis, management options, expected outcomes, and the reasons for scheduled and emergent follow-up. Questions were adequately answered. Patient expressed verbal understanding and agreement with the following plan.     Patient Education:  Reviewed with patient the risks (i.e, a repeat concussion, post-concussion syndrome, second-impact syndrome) of returning to play prior to complete resolution, and thoroughly reviewed the signs and symptoms of  concussion.Reviewed need for complete resolution of all symptoms, with rest AND exertion, prior to return to play.  Reviewed red flags for urgent medical evaluation: worsening symptoms, nausea/vomiting, intractable headache, musculoskeletal changes, focal neurological deficits.  [facility] Concussion Care Plan, which clearly outlines the plans stated above, was given to patient.   In addition to the time spent performing tests, I spent 60 min   Reviewed with patient the risks (i.e, a repeat concussion, post-concussion syndrome, second-impact syndrome) of returning to play prior to complete resolution, and thoroughly reviewed the signs and symptoms of      concussion. Reviewedf need for complete resolution of all symptoms, with rest AND exertion, prior to return to play.  Reviewed red flags for urgent medical evaluation: worsening symptoms, nausea/vomiting, intractable headache, musculoskeletal changes, focal neurological deficits.  [facility] Concussion Care Plan, which clearly outlines the plans stated above, was given to patient  After Visit Summary printed out and provided to patient as appropriate.  The above documentation has been reviewed and is accurate and complete Lynne Leader

## 2020-03-29 ENCOUNTER — Other Ambulatory Visit: Payer: Self-pay

## 2020-03-29 ENCOUNTER — Telehealth: Payer: Self-pay | Admitting: Physical Therapy

## 2020-03-29 ENCOUNTER — Ambulatory Visit: Payer: Medicare Other | Admitting: Family Medicine

## 2020-03-29 ENCOUNTER — Encounter: Payer: Self-pay | Admitting: Family Medicine

## 2020-03-29 VITALS — BP 124/64 | HR 75 | Ht 67.0 in | Wt 158.2 lb

## 2020-03-29 DIAGNOSIS — H9193 Unspecified hearing loss, bilateral: Secondary | ICD-10-CM

## 2020-03-29 DIAGNOSIS — R410 Disorientation, unspecified: Secondary | ICD-10-CM | POA: Diagnosis not present

## 2020-03-29 DIAGNOSIS — S0990XA Unspecified injury of head, initial encounter: Secondary | ICD-10-CM

## 2020-03-29 DIAGNOSIS — E1169 Type 2 diabetes mellitus with other specified complication: Secondary | ICD-10-CM

## 2020-03-29 DIAGNOSIS — I10 Essential (primary) hypertension: Secondary | ICD-10-CM

## 2020-03-29 DIAGNOSIS — R262 Difficulty in walking, not elsewhere classified: Secondary | ICD-10-CM

## 2020-03-29 DIAGNOSIS — E119 Type 2 diabetes mellitus without complications: Secondary | ICD-10-CM

## 2020-03-29 DIAGNOSIS — S02109A Fracture of base of skull, unspecified side, initial encounter for closed fracture: Secondary | ICD-10-CM

## 2020-03-29 DIAGNOSIS — Z960 Presence of urogenital implants: Secondary | ICD-10-CM

## 2020-03-29 DIAGNOSIS — R339 Retention of urine, unspecified: Secondary | ICD-10-CM

## 2020-03-29 DIAGNOSIS — S06309A Unspecified focal traumatic brain injury with loss of consciousness of unspecified duration, initial encounter: Secondary | ICD-10-CM

## 2020-03-29 HISTORY — DX: Essential (primary) hypertension: I10

## 2020-03-29 HISTORY — DX: Type 2 diabetes mellitus without complications: E11.9

## 2020-03-29 NOTE — Patient Instructions (Signed)
Thank you for coming in today.  I will refer to neurology and to urology.  I will also refer to home health physical therapy.  I will request medical records.   Recheck in about 2 weeks.  Let me know if you need medicine refills or other needs.   Let me know if you do not hear from the referrals.

## 2020-03-29 NOTE — Telephone Encounter (Signed)
Pt and son [name] forgot to ask 2 questions while in for [place]'s visit.  1) Pt is supposed to be checking his blood glucose levels bid and son wants to know what value is too high in terms of being concerned enough that he needs to contact MD and 2) Pt has gotten the Moderna Covid vaccine and would like to get the booster but wants to know if it's ok for him to get either the Moderna or [employer] booster and if there's a preference.  Please advise.

## 2020-03-30 ENCOUNTER — Telehealth: Payer: Self-pay

## 2020-03-30 NOTE — Telephone Encounter (Signed)
Patients son called back stating that fathers blood sugar level was at 219. Stated was told to call if blood sugars were over 200.

## 2020-03-30 NOTE — Telephone Encounter (Signed)
Dr. Denyse Amass returned son's call regarding pt's blood glucose level of 219 and discussed plan for moving forward.

## 2020-03-30 NOTE — Telephone Encounter (Signed)
Continue current treatment. Lets generate a log and review it at next visit.  OK is blood sugars higher than 200.

## 2020-03-30 NOTE — Telephone Encounter (Signed)
Either vaccine would be fine.  I do not think it makes much of a difference.  There is some low quality evidence to suggest that mixing the vaccines does not produce superior results but I would say that is not a reason to go out of your way to get a different kind of a vaccine.  I like to know if the blood sugars are less than 60 or more than 200.  Otherwise keep a log and bring it with you at the next visit.

## 2020-03-30 NOTE — Telephone Encounter (Signed)
Read Dr. Zollie Pee response to pt son/Scot, expressed understanding and will contact us if blood sugars are outside of mentioned ranged.

## 2020-03-30 NOTE — Progress Notes (Addendum)
03/31/2020 8:29 AM   Alexander Harmon 10/03/1933 [id number]  Referring provider: Rodolph Bong, MD [address] [place],  [place] [address] Chief Complaint  Patient presents with  . Urinary Retention    HPI: Alexander Harmon is a 86 y.o. male who presents today for evaluation and management of urinary retention. Patient is accompanied by his son.   - 2 weeks ago fell down stairs and hospitalized 1 week in [place] with closed head injury -Patient did not qualify for inpatient rehab hospital and staying with his son in [place] after discharge.  -Catheter placed for urinary retention during that hospitalization; failed voiding trials x3 -Did not have sensation of urge to void or bladder fullness -Prior to injury no history of urologic or voiding problems -2 days ago son noted blood in catheter which he described as Mer[place] colored which has resolved; not on aspirin or blood thinners -Has noted some increased lethargy and blood sugars have been over 300 -No fever or chills -Started on tamsulosin during hospitalization   PMH: Past Medical History:  Diagnosis Date  . Diabetes (HCC) 03/29/2020  . Essential hypertension 03/29/2020    Surgical History: History reviewed. No pertinent surgical history.  Home Medications:  Allergies as of 03/31/2020   No Known Allergies     Medication List       Accurate as of March 31, 2020  8:29 AM. If you have any questions, ask your nurse or doctor.        acetaminophen 500 MG tablet Commonly known as: TYLENOL Take 500 mg by mouth every 6 (six) hours as needed.   lisinopril 40 MG tablet Commonly known as: ZESTRIL Take 40 mg by mouth daily.   lovastatin 40 MG tablet Commonly known as: MEVACOR Take 40 mg by mouth at bedtime.   meclizine 25 MG tablet Commonly known as: ANTIVERT Take 25 mg by mouth 3 (three) times daily as needed for dizziness.   metFORMIN 500 MG tablet Commonly known as: GLUCOPHAGE Take  by mouth 2 (two) times daily with a meal.   pioglitazone 30 MG tablet Commonly known as: ACTOS Take 30 mg by mouth daily.   tamsulosin 0.4 MG Caps capsule Commonly known as: FLOMAX Take 0.4 mg by mouth.       Allergies: No Known Allergies  Family History: History reviewed. No pertinent family history.  Social History:  reports that he has never smoked. He has never used smokeless tobacco. No history on file for alcohol use and drug use.   Physical Exam: BP 133/71   Pulse 97   Ht 5\' 7"  (1.702 m)   Wt 158 lb (71.7 kg)   BMI 24.75 kg/m   Constitutional:  Alert and oriented, No acute distress. HEENT: [place] AT, moist mucus membranes.  Trachea midline, no masses. Cardiovascular: No clubbing, cyanosis, or edema. Respiratory: Normal respiratory effort, no increased work of breathing. GU: Prostate 60+ cc, asymmetric L >R with mild increased firmness on the left; urine in catheter bag clear Skin: No rashes, bruises or suspicious lesions. Neurologic: Grossly intact, no focal deficits, moving all 4 extremities. Psychiatric: Normal mood and affect.   Assessment & Plan:    1. Urinary retention  Etiology is likely related to head injury with underlying BPH  Past 24-48 hours with increased lethargy, elevated blood sugar and hematuria; discussed possibility of UTI  Catheter clamped and urine sent for culture  Will start empiric antibiotics, Rx Septra DS sent to pharmacy  Follow-up next week with  PA visit for catheter removal/voiding trial  2.  BPH  Asymmetry of prostate with mild increased firmness  Recommend follow-up exam 4-8 weeks  Will discuss PSA for persistently abnormal DRE  3.  Gross hematuria  If urine culture negative would recommend follow-up cystoscopy   [facility] [address], [address] [place], [place] [address] [phone]  I, Theador Hawthorne, am acting as a scribe for Dr. [name] C. Rion Catala,  I have reviewed the above  documentation for accuracy and completeness, and I agree with the above.    [name], MD

## 2020-03-31 ENCOUNTER — Telehealth: Payer: Self-pay | Admitting: Family Medicine

## 2020-03-31 ENCOUNTER — Encounter: Payer: Self-pay | Admitting: Urology

## 2020-03-31 ENCOUNTER — Other Ambulatory Visit: Payer: Self-pay

## 2020-03-31 ENCOUNTER — Ambulatory Visit (INDEPENDENT_AMBULATORY_CARE_PROVIDER_SITE_OTHER): Payer: Medicare Other | Admitting: Urology

## 2020-03-31 VITALS — BP 133/71 | HR 97 | Ht 67.0 in | Wt 158.0 lb

## 2020-03-31 DIAGNOSIS — R310 Gross hematuria: Secondary | ICD-10-CM

## 2020-03-31 DIAGNOSIS — R339 Retention of urine, unspecified: Secondary | ICD-10-CM | POA: Diagnosis not present

## 2020-03-31 MED ORDER — SULFAMETHOXAZOLE-TRIMETHOPRIM 800-160 MG PO TABS: 1.0000 | ORAL_TABLET | Freq: Two times a day (BID) | ORAL | 0 refills | Status: AC

## 2020-03-31 MED ORDER — GLIMEPIRIDE 2 MG PO TABS: 2.0000 mg | ORAL_TABLET | Freq: Every day | ORAL | 1 refills | Status: DC

## 2020-03-31 NOTE — Telephone Encounter (Signed)
Alexander Harmon, pt son called about pt sugar levels.  11/4 5 pm     284 11/4 930 pm  307 11/5 AM         198   Alexander Harmon understands the parameters, but is unsure at what point he should be seeking care/UC for his dad when the #'s are consistently high. He is worried also because the weekend is approaching.

## 2020-03-31 NOTE — Telephone Encounter (Signed)
Returned call to pt's son [name] and informed him of Dr. Zollie Pee response and new glimepiride rx.  Dr. Denyse Amass also speaks to [facility] regarding some other issues.

## 2020-03-31 NOTE — Addendum Note (Signed)
Addended by: Rodolph Bong on: 03/31/2020 07:55 AM   Modules accepted: Orders

## 2020-03-31 NOTE — Telephone Encounter (Signed)
One of the diabetes medicines prior to hospitalization was not restarted.  This is glimepiride.  However given his high blood sugars I am restarting this medication at a medium dose of 2 mg daily.  I have sent this to the [facility] pharmacy in [place].  Please restart the medication and keep me updated with blood sugars.  Currently his blood sugars are okay and we are starting to adjust his diabetes medicines to manage them better.

## 2020-04-03 ENCOUNTER — Telehealth: Payer: Self-pay | Admitting: Family Medicine

## 2020-04-03 NOTE — Telephone Encounter (Signed)
Try to establish care with the first available provider in the clinic where Dr. Birdie Sons is.  I will continue treatment until then.  If push comes to shove I have some other potential options but I think that is your best bet for now.  Alexander Harmon

## 2020-04-03 NOTE — Telephone Encounter (Signed)
Called and LM for son [name] to return call to office.  Please advise per Dr. Zollie Pee last note.

## 2020-04-03 NOTE — Telephone Encounter (Signed)
Patient's son called back. Given MD response.

## 2020-04-03 NOTE — Telephone Encounter (Signed)
It looks like there are a couple of openings in the next week or so. I will have my office reach back out to him to try to get him scheduled.

## 2020-04-03 NOTE — Telephone Encounter (Signed)
Patient's son called back. He said that Dr Purvis Sheffield office told him they are at 100% capacity so they are not able to get him in sooner.

## 2020-04-03 NOTE — Telephone Encounter (Signed)
Patient's son called stating that he reached out to Dr Shyrl Numbers office to see about setting up am appointment for his dad to establish care but they are not able to get him in until the first of the year. He wanted to know if Dr Denyse Amass had any other ideas on who he could see or what he needed to do?  Please advise.

## 2020-04-04 NOTE — Telephone Encounter (Signed)
Please update son. Looks like things will work out with Dr Birdie Sons

## 2020-04-04 NOTE — Telephone Encounter (Signed)
Called son and informed him of update regarding a visit for his dad at Dr. Purvis Sheffield office.

## 2020-04-04 NOTE — Telephone Encounter (Signed)
Called son [name]) and made an appt for pt on 04/10/20 with Dr Birdie Sons.

## 2020-04-06 ENCOUNTER — Telehealth: Payer: Self-pay | Admitting: Family Medicine

## 2020-04-06 ENCOUNTER — Other Ambulatory Visit: Payer: Self-pay

## 2020-04-06 ENCOUNTER — Ambulatory Visit: Payer: Medicare Other | Admitting: Physician Assistant

## 2020-04-06 VITALS — BP 135/72 | HR 72 | Ht 67.0 in | Wt 150.0 lb

## 2020-04-06 DIAGNOSIS — R339 Retention of urine, unspecified: Secondary | ICD-10-CM

## 2020-04-06 LAB — CULTURE, URINE COMPREHENSIVE

## 2020-04-06 LAB — BLADDER SCAN AMB NON-IMAGING

## 2020-04-06 NOTE — Telephone Encounter (Signed)
Scot unsure about Neurology referral. Pt saw neurosurgeon last week, was advised no surgery needed and they recommended 1 month f/u after CT.   Questions: Does Dr. Denyse Amass still want pt to follow up with Neurology?  Also, pt scheduled to see Dr. Birdie Sons on 11/15, does he still need to follow up with [place] on 11/16?  Son is happy to complete these appts, just wants to be sure he is on point.

## 2020-04-06 NOTE — Progress Notes (Signed)
04/06/2020 10:00 AM   Alexander Harmon 1933-09-15 [id number]  CC: Chief Complaint  Patient presents with  . Urinary Retention    HPI: Alexander Harmon is a 86 y.o. male with a recent history of urinary retention after sustaining a closed head injury secondary to a fall who presents today for outpatient voiding trial.  He was seen in clinic most recently on 03/31/2020 for urologic follow-up of his recent retention.  Urine culture from that visit resulted with multidrug-resistant Klebsiella aerogenes; Dr. Lonna Cobb treated him with Bactrim DS twice daily x7 days.  Today he reports doing well.  He has been taking antibiotics as prescribed.  He continues to take Flomax as prescribed.  He reports improvement in his lethargy as previously reported.  PMH: Past Medical History:  Diagnosis Date  . Diabetes (HCC) 03/29/2020  . Essential hypertension 03/29/2020    Surgical History: No past surgical history on file.  Home Medications:  Allergies as of 04/06/2020   No Known Allergies     Medication List       Accurate as of April 06, 2020 10:00 AM. If you have any questions, ask your nurse or doctor.        acetaminophen 500 MG tablet Commonly known as: TYLENOL Take 500 mg by mouth every 6 (six) hours as needed.   glimepiride 2 MG tablet Commonly known as: Amaryl Take 1 tablet (2 mg total) by mouth daily with breakfast.   lisinopril 40 MG tablet Commonly known as: ZESTRIL Take 40 mg by mouth daily.   lovastatin 40 MG tablet Commonly known as: MEVACOR Take 40 mg by mouth at bedtime.   meclizine 25 MG tablet Commonly known as: ANTIVERT Take 25 mg by mouth 3 (three) times daily as needed for dizziness.   metFORMIN 500 MG tablet Commonly known as: GLUCOPHAGE Take by mouth 2 (two) times daily with a meal.   pioglitazone 30 MG tablet Commonly known as: ACTOS Take 30 mg by mouth daily.   sulfamethoxazole-trimethoprim 800-160 MG tablet Commonly known as:  BACTRIM DS Take 1 tablet by mouth 2 (two) times daily for 7 days.   tamsulosin 0.4 MG Caps capsule Commonly known as: FLOMAX Take 0.4 mg by mouth.       Allergies:  No Known Allergies  Family History: No family history on file.  Social History:   reports that he has never smoked. He has never used smokeless tobacco. No history on file for alcohol use and drug use.  Physical Exam: BP 135/72 (BP Location: Left Arm, Patient Position: Sitting, Cuff Size: Normal)   Pulse 72   Ht 5\' 7"  (1.702 m)   Wt 150 lb (68 kg)   BMI 23.49 kg/m   Constitutional:  Alert and oriented, no acute distress, nontoxic appearing HEENT: [place], AT Cardiovascular: No clubbing, cyanosis, or edema Respiratory: Normal respiratory effort, no increased work of breathing Skin: No rashes, bruises or suspicious lesions Neurologic: Grossly intact, no focal deficits, moving all 4 extremities Psychiatric: Normal mood and affect  Laboratory Data: Results for orders placed or performed in visit on 04/06/20  BLADDER SCAN AMB NON-IMAGING  Result Value Ref Range   Scan Result 22mL    Assessment & Plan:   1. Urinary retention Foley catheter removed in the AM, see separate procedure note for details. Patient returned to clinic this afternoon for repeat PVR. He reports drinking approximately 36oz of fluid. He has not been able to urinate. PVR 30mL.  Voiding trial equivocal. Counseled patient to  return to clinic tomorrow morning for repeat PVR.  He expressed understanding. - BLADDER SCAN AMB NON-IMAGING   Return in about 1 day (around 04/07/2020) for Repeat PVR.  Carman Ching, PA-C  [facility] [address], [address] [place], [place] [address] [phone]

## 2020-04-06 NOTE — Telephone Encounter (Signed)
Lets schedule the neurology appointment for a few months. They will likely take over remaining neurological needs after neurosurgery has seen your dad a few times.  If he is just getting a lot better we may decide to cancel it.   Clayburn Pert

## 2020-04-06 NOTE — Progress Notes (Signed)
Catheter Removal  Patient is present today for a catheter removal.  60ml of water was drained from the balloon. A 16FR silicone foley cath was removed from the bladder no complications were noted . Patient tolerated well.  Performed by: Carman Ching, PA-C   Follow up/ Additional notes: Push fluids and RTC this afternoon for PVR.

## 2020-04-06 NOTE — Telephone Encounter (Signed)
What about the piece of the note regarding whether Zevin needs to keep appt w/ you on 04/11/20 if he's seeing [place] on 04/10/20?  Yes or no to keep appt w/ you on the 16th?

## 2020-04-07 ENCOUNTER — Ambulatory Visit (INDEPENDENT_AMBULATORY_CARE_PROVIDER_SITE_OTHER): Payer: Medicare Other | Admitting: Physician Assistant

## 2020-04-07 ENCOUNTER — Encounter: Payer: Self-pay | Admitting: Physician Assistant

## 2020-04-07 VITALS — BP 120/66 | HR 80

## 2020-04-07 DIAGNOSIS — R339 Retention of urine, unspecified: Secondary | ICD-10-CM | POA: Diagnosis not present

## 2020-04-07 LAB — BLADDER SCAN AMB NON-IMAGING
Scan Result: 301
Scan Result: 419

## 2020-04-07 NOTE — Telephone Encounter (Signed)
Called pt's son [name] and relayed appropriate info.  He states that pt's appt w/ Dr. Birdie Sons is at 8 am on Monday, Nov.15th so they will keep appt w/ Dr. Denyse Amass on 04/11/20 as is.

## 2020-04-07 NOTE — Patient Instructions (Signed)
Start urinating every 2 hours during the daytime regardless of if you feel you need to urinate or not. If you develop lower abdominal pain or swelling or feel you are not emptying your bladder well over the weekend, go to the Emergency Department immediately.

## 2020-04-07 NOTE — Progress Notes (Signed)
Patient returned to clinic this morning for repeat PVR.  He has voided multiple times since yesterday.  PVR initially 419 mL; he was prompted to void with repeat PVR 301 mL.  He denies lower abdominal discomfort.  Results for orders placed or performed in visit on 04/07/20  Bladder Scan (Post Void Residual) in office  Result Value Ref Range   Scan Result 419    Scan Result 301     In shared decision-making with the patient and his son, I offered him Foley catheter placement this morning with repeat voiding trial next week versus conservative management with timed voiding every 2 hours during the day with repeat PVR Monday morning.  Patient elected for the latter.  We discussed the risks of conservative management including recurrent urinary retention and the need to proceed to the emergency department over the weekend for Foley catheter replacement.  He understands these risks and wishes to proceed.  We reviewed return instructions including lower abdominal pain, abdominal distention, and the sensation of incomplete voiding.  I explained that if he develops these over the weekend, he is to proceed to the emergency department immediately for Foley catheter placement.  He expressed understanding.

## 2020-04-07 NOTE — Telephone Encounter (Signed)
Yes please keep appointment with me or even move it to before appointment with Dr Birdie Sons.

## 2020-04-10 ENCOUNTER — Ambulatory Visit (INDEPENDENT_AMBULATORY_CARE_PROVIDER_SITE_OTHER): Payer: Medicare Other | Admitting: Physician Assistant

## 2020-04-10 ENCOUNTER — Telehealth: Payer: Self-pay | Admitting: Family Medicine

## 2020-04-10 ENCOUNTER — Encounter: Payer: Self-pay | Admitting: Family Medicine

## 2020-04-10 ENCOUNTER — Other Ambulatory Visit: Payer: Self-pay | Admitting: Unknown Physician Specialty

## 2020-04-10 ENCOUNTER — Ambulatory Visit: Payer: Medicare Other | Admitting: Family Medicine

## 2020-04-10 ENCOUNTER — Other Ambulatory Visit: Payer: Self-pay

## 2020-04-10 VITALS — BP 120/71 | HR 76 | Ht 65.0 in | Wt 152.0 lb

## 2020-04-10 VITALS — BP 130/70 | HR 73 | Temp 99.1°F | Ht 67.0 in | Wt 151.8 lb

## 2020-04-10 DIAGNOSIS — Z23 Encounter for immunization: Secondary | ICD-10-CM | POA: Diagnosis not present

## 2020-04-10 DIAGNOSIS — S0219XA Other fracture of base of skull, initial encounter for closed fracture: Secondary | ICD-10-CM | POA: Insufficient documentation

## 2020-04-10 DIAGNOSIS — R42 Dizziness and giddiness: Secondary | ICD-10-CM | POA: Diagnosis not present

## 2020-04-10 DIAGNOSIS — R339 Retention of urine, unspecified: Secondary | ICD-10-CM | POA: Insufficient documentation

## 2020-04-10 DIAGNOSIS — E1169 Type 2 diabetes mellitus with other specified complication: Secondary | ICD-10-CM

## 2020-04-10 DIAGNOSIS — S065XAA Traumatic subdural hemorrhage with loss of consciousness status unknown, initial encounter: Secondary | ICD-10-CM | POA: Insufficient documentation

## 2020-04-10 DIAGNOSIS — N179 Acute kidney failure, unspecified: Secondary | ICD-10-CM

## 2020-04-10 DIAGNOSIS — I10 Essential (primary) hypertension: Secondary | ICD-10-CM

## 2020-04-10 DIAGNOSIS — S0990XA Unspecified injury of head, initial encounter: Secondary | ICD-10-CM | POA: Diagnosis not present

## 2020-04-10 DIAGNOSIS — S065X9A Traumatic subdural hemorrhage with loss of consciousness of unspecified duration, initial encounter: Secondary | ICD-10-CM | POA: Insufficient documentation

## 2020-04-10 DIAGNOSIS — I609 Nontraumatic subarachnoid hemorrhage, unspecified: Secondary | ICD-10-CM

## 2020-04-10 LAB — COMPREHENSIVE METABOLIC PANEL
ALT: 12 U/L (ref 0–53)
AST: 16 U/L (ref 0–37)
Albumin: 4.2 g/dL (ref 3.5–5.2)
Alkaline Phosphatase: 79 U/L (ref 39–117)
BUN: 38 mg/dL — ABNORMAL HIGH (ref 6–23)
CO2: 23 mEq/L (ref 19–32)
Calcium: 9.7 mg/dL (ref 8.4–10.5)
Chloride: 102 mEq/L (ref 96–112)
Creatinine, Ser: 1.74 mg/dL — ABNORMAL HIGH (ref 0.40–1.50)
GFR: 35.7 mL/min — ABNORMAL LOW (ref >60.00)
Glucose, Bld: 143 mg/dL — ABNORMAL HIGH (ref 70–99)
Potassium: 6.2 mEq/L (ref 3.5–5.1)
Sodium: 135 mEq/L (ref 135–145)
Total Bilirubin: 0.4 mg/dL (ref 0.2–1.2)
Total Protein: 7 g/dL (ref 6.0–8.3)

## 2020-04-10 LAB — LIPID PANEL
Cholesterol: 162 mg/dL (ref 0–200)
HDL: 44.1 mg/dL (ref >39.00)
LDL Cholesterol: 90 mg/dL (ref 0–99)
NonHDL: 117.61
Total CHOL/HDL Ratio: 4
Triglycerides: 139 mg/dL (ref 0.0–149.0)
VLDL: 27.8 mg/dL (ref 0.0–40.0)

## 2020-04-10 LAB — CBC
HCT: 32.4 % — ABNORMAL LOW (ref 39.0–52.0)
Hemoglobin: 10.9 g/dL — ABNORMAL LOW (ref 13.0–17.0)
MCHC: 33.6 g/dL (ref 30.0–36.0)
MCV: 90 fl (ref 78.0–100.0)
Platelets: 314 10*3/uL (ref 150.0–400.0)
RBC: 3.6 Mil/uL — ABNORMAL LOW (ref 4.22–5.81)
RDW: 14 % (ref 11.5–15.5)
WBC: 5.8 10*3/uL (ref 4.0–10.5)

## 2020-04-10 LAB — BLADDER SCAN AMB NON-IMAGING

## 2020-04-10 MED ORDER — GLUCOSE BLOOD VI STRP: ORAL_STRIP | 12 refills | Status: AC

## 2020-04-10 MED ORDER — MECLIZINE HCL 25 MG PO TABS: 25.0000 mg | ORAL_TABLET | Freq: Three times a day (TID) | ORAL | 2 refills | Status: AC | PRN

## 2020-04-10 NOTE — Progress Notes (Signed)
Simple Catheter Placement  Due to urinary retention patient is present today for a foley cath placement.  Patient was cleaned and prepped in a sterile fashion with betadine and 2% lidocaine jelly was instilled into the urethra. A 16 FR foley catheter was inserted, urine return was noted  , urine was yellow in color.  The balloon was filled with 10cc of sterile water.  A leg bag was attached for drainage. Patient was also given a night bag to take home and was given instruction on how to change from one bag to another.  Patient was given instruction on proper catheter care.  Patient tolerated well, no complications were noted   Performed by: Carman Ching, PA-C

## 2020-04-10 NOTE — Assessment & Plan Note (Signed)
From head injury.  We will see if neurosurgery would like to proceed with follow-up CT imaging at 1 month post injury.

## 2020-04-10 NOTE — Assessment & Plan Note (Signed)
Related to head injury.  Will see if neurosurgery wants to proceed with follow-up CT imaging at 1 month.

## 2020-04-10 NOTE — Assessment & Plan Note (Signed)
This likely has contributed to his left sided hearing loss.  He will continue to see ENT for this.

## 2020-04-10 NOTE — Progress Notes (Signed)
I, Peterson Lombard, LAT, ATC acting as a scribe for Lynne Leader, MD.  Alexander Harmon is a 86 y.o. male who presents to [facility] today for f/u head injury and overall primary care coordination.  Since the last visit Tiffany has had a lot of progress.  He is establish care with a primary care provider Dr. Biagio Quint, see been seen by urology, ENT, and neurosurgery.  He is started home health physical therapy and home health speech therapy and is about to start outpatient occupational therapy.  Skull fracture/Neurosurgery: Feels stable to improved.  Son thinks he is stable to slightly improved.  Patient due for recheck CT scan head as recommended by original neurosurgery discharge at the end of the month.  Patient has follow-up appointment scheduled with neurosurgery at [facility] December 7.  Urology: Had voiding trial and failed.  Urinary catheter reinserted yesterday with large post void residual greater than 500 mL.  Kidney function was checked by PCP yesterday and creatinine was elevated at 1.7 and potassium significantly elevated at 6.2.  Of note prior to discharge her renal function showed elevated creatinine of 1.9 but normal potassium.  Diabetes: Established with new PCP.  Much improved on current regimen.  Amaryl 2 mg was added.  Blood sugars now in the 120 range most of the time.  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  Has temporal small slice CT scan scheduled for the 24th of this month and more hearing test scheduled.    Kidney failure: As noted above renal function around 1.7 thought to be chronic potassium was recently elevated.  Patient due for recheck metabolic panel today.  PCP ordered it.    Pertinent review of systems: No fevers or chills  Relevant historical information: As above history of diabetes and likely CKD 3   Exam:  BP 128/74 (BP Location: Right Arm, Patient Position: Sitting,  Cuff Size: Normal)   Pulse 80   Ht 5' 5"  (1.651 m)   Wt 150 lb (68 kg)   SpO2 97%   BMI 24.96 kg/m  General: Well Developed, well nourished, and in no acute distress.   Neuropsych: Slightly unstable gait.  Alert and oriented    Lab and Radiology Results Results for orders placed or performed in visit on 04/10/20 (from the past 72 hour(s))  Comp Met (CMET)     Status: Abnormal   Collection Time: 04/10/20  8:48 AM  Result Value Ref Range   Sodium 135 135 - 145 mEq/L   Potassium 6.2 No hemolysis seen (HH) 3.5 - 5.1 mEq/L   Chloride 102 96 - 112 mEq/L   CO2 23 19 - 32 mEq/L   Glucose, Bld 143 (H) 70 - 99 mg/dL   BUN 38 (H) 6 - 23 mg/dL   Creatinine, Ser 1.74 (H) 0.40 - 1.50 mg/dL   Total Bilirubin 0.4 0.2 - 1.2 mg/dL   Alkaline Phosphatase 79 39 - 117 U/L   AST 16 0 - 37 U/L   ALT 12 0 - 53 U/L   Total Protein 7.0 6.0 - 8.3 g/dL   Albumin 4.2 3.5 - 5.2 g/dL   GFR 35.70 (L) >60.00 mL/min    Comment: Calculated using the CKD-EPI Creatinine Equation (2021)   Calcium 9.7 8.4 - 10.5 mg/dL  Lipid panel     Status: None   Collection Time: 04/10/20  8:48 AM  Result Value Ref Range   Cholesterol 162  0 - 200 mg/dL    Comment: ATP III Classification       Desirable:  < 200 mg/dL               Borderline High:  200 - 239 mg/dL          High:  > = 240 mg/dL   Triglycerides 139.0 0 - 149 mg/dL    Comment: Normal:  <150 mg/dLBorderline High:  150 - 199 mg/dL   HDL 44.10 >39.00 mg/dL   VLDL 27.8 0.0 - 40.0 mg/dL   LDL Cholesterol 90 0 - 99 mg/dL   Total CHOL/HDL Ratio 4     Comment:                Men          Women1/2 Average Risk     3.4          3.3Average Risk          5.0          4.42X Average Risk          9.6          7.13X Average Risk          15.0          11.0                       NonHDL 117.61     Comment: NOTE:  Non-HDL goal should be 30 mg/dL higher than patient's LDL goal (i.e. LDL goal of < 70 mg/dL, would have non-HDL goal of < 100 mg/dL)  CBC     Status: Abnormal    Collection Time: 04/10/20  8:48 AM  Result Value Ref Range   WBC 5.8 4.0 - 10.5 K/uL   RBC 3.60 (L) 4.22 - 5.81 Mil/uL   Platelets 314.0 150 - 400 K/uL   Hemoglobin 10.9 (L) 13.0 - 17.0 g/dL   HCT 32.4 (L) 39 - 52 %   MCV 90.0 78.0 - 100.0 fl   MCHC 33.6 30.0 - 36.0 g/dL   RDW 14.0 11.5 - 15.5 %   No results found.     Assessment and Plan: 86 y.o. male with  Skull fracture/neurosurgery: Due for repeat CT scan of the head the end of November early December.  When I had ordered it now to be scheduled last week of November 1 week of December.  Patient has a follow-up appointment with neurosurgery December 7.  This data may be helpful.  Urinary retention: Following up with urology now.  Etiology remains somewhat unclear thought to be possibly neurogenic bladder or BPH obstructive type both.  Patient failed voiding trial.  Urinary catheter reinserted.  Continue to follow-up with urology.  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  Patient has fine slice CT scan scheduled to further evaluate this with ENT.  Continue to follow-up with ENT.  Warned patient that hearing loss may be permanent and may require hearing aids or cochlear implant in the future.  Diabetes: Much improved continue current regimen follow-up with PCP for this issue in the future.  Metabolic disruption with elevated potassium.  Likely secondary to urinary retention.  Metabolic panel will be drawn today and results sent to PCP.  Gait disruption/PT needs.  Receiving great physical therapy.  We will continue to coordinate care for this if needed.  Patient now scheduled for neurology at the end of January which is acceptable.  Check back with  me as needed for this or other issues.   PDMP not reviewed this encounter. Orders Placed This Encounter  Procedures  . CT HEAD WO CONTRAST    Standing Status:   Future    Standing Expiration Date:   04/11/2021    Order Specific Question:   Preferred imaging location?     Answer:   [facility]   No orders of the defined types were placed in this encounter.    Discussed warning signs or symptoms. Please see discharge instructions. Patient expresses understanding.   The above documentation has been reviewed and is accurate and complete Lynne Leader, M.D.

## 2020-04-10 NOTE — Telephone Encounter (Signed)
I called and spoke with the patient's son and informed him that his strips was sent to the pharmacy and explained about the information from the urologist and he understood.  The p[patient was scheduled for a lab appointment on tomorrow per provider to check for kidney function and potassium.  Jamyron Redd,cma

## 2020-04-10 NOTE — Assessment & Plan Note (Signed)
Uncontrolled with an A1c of 8.0.  Discussed that glimepiride and Actos would not be my first choices for diabetic management.  Discussed the potential for a once weekly injection that is not an insulin such as Ozempic though the patient declined any injectable therapy.  Advised that I would need to see what his kidney function reveals prior to making any recommendations for medication changes.  For now he will continue Metformin 500 mg twice daily, Actos 30 mg daily, and glimepiride 2 mg once daily.  Discussed the risk of hypoglycemia with glimepiride and cardiac issues with the Actos.

## 2020-04-10 NOTE — Telephone Encounter (Signed)
Please let the patient and his son know that his potassium is elevated at 6.2 and his kidney function is worse with a creatinine of 1.74. This may be related to his urine retention and the fact that he is on lisinopril. Please see if he has had any palpitations or muscle weakness. If he has had those symptoms or if he develops those symptoms he should go to the ED. I have reached out to the urologist he saw today to get her input on this as he had a catheter placed. Depending on what they say the patient may need to go to the ED for further evaluation and management. Once I hear back from them we will let the patient know. He should also stop his lisinopril.

## 2020-04-10 NOTE — Assessment & Plan Note (Signed)
Adequate control for age.  He will continue lisinopril 40 mg once daily.  Check labs.  Addendum: Lab work returned with elevated creatinine and potassium.  See phone note for documentation.  Patient noted no palpitations or muscle weakness.  I messaged with urology via secure chat and they placed a Foley catheter earlier today for urinary retention.  I suspect that has contributed to his increased creatinine and that his creatinine will improve over the next several days with the Foley in place.  I suspect the lisinopril is playing a role in combination with urinary retention to cause his hyperkalemia.  We will have him discontinue his lisinopril and recheck labs on Tuesday 11/16.  The patient's son was advised to have him go to the emergency department if you develop muscle weakness, chest pain, or palpitations.  See phone note for documentation.

## 2020-04-10 NOTE — Assessment & Plan Note (Signed)
Patient is doing relatively well following his prior head injury.  He has established care with the appropriate specialists and he will continue to see neurosurgery, ENT, and sports medicine.  His son notes at some point they were advised he needed to see a neurologist on not sure there is a specific reason for him to do so at this time.  I encouraged him to participate in physical therapy and speech therapy.  They question the need for occupational therapy and I suggested that they see them at least once to see if they have anything to offer.  I will send a message to the patient's neurosurgeon to see what they think about a follow-up CT scan.

## 2020-04-10 NOTE — Progress Notes (Signed)
04/10/2020 9:34 AM   Alexander Harmon 1933/09/12 [id number]  CC: Chief Complaint  Patient presents with  . Urinary Retention    HPI: Alexander Harmon is a 86 y.o. male with a recent history of urinary retention after sustaining a closed head injury secondary to a fall who presents today for repeat PVR.  I saw him in clinic most recently 3 days ago for follow-up PVR, which was equivocal.  He elected for timed voiding and repeat PVR in 3 days versus Foley catheter replacement at that time.  Today he reports small volume voids throughout the weekend every 2 hours as instructed. He denies abdominal pain or bladder fullness.  PVR .  PMH: Past Medical History:  Diagnosis Date  . Diabetes (HCC) 03/29/2020  . Essential hypertension 03/29/2020    Surgical History: No past surgical history on file.  Home Medications:  Allergies as of 04/10/2020   No Known Allergies     Medication List       Accurate as of April 10, 2020  9:34 AM. If you have any questions, ask your nurse or doctor.        acetaminophen 500 MG tablet Commonly known as: TYLENOL Take 500 mg by mouth every 6 (six) hours as needed.   glimepiride 2 MG tablet Commonly known as: Amaryl Take 1 tablet (2 mg total) by mouth daily with breakfast.   lisinopril 40 MG tablet Commonly known as: ZESTRIL Take 40 mg by mouth daily.   lovastatin 40 MG tablet Commonly known as: MEVACOR Take 40 mg by mouth at bedtime.   meclizine 25 MG tablet Commonly known as: ANTIVERT Take 1 tablet (25 mg total) by mouth 3 (three) times daily as needed for dizziness.   metFORMIN 500 MG tablet Commonly known as: GLUCOPHAGE Take by mouth 2 (two) times daily with a meal.   pioglitazone 30 MG tablet Commonly known as: ACTOS Take 30 mg by mouth daily.   tamsulosin 0.4 MG Caps capsule Commonly known as: FLOMAX Take 0.4 mg by mouth.      Allergies:  No Known Allergies  Family History: No family history  on file.  Social History:   reports that he has never smoked. He has never used smokeless tobacco. No history on file for alcohol use and drug use.  Physical Exam: BP 120/71 (BP Location: Left Arm, Patient Position: Sitting, Cuff Size: Normal)   Pulse 76   Ht 5\' 5"  (1.651 m)   Wt 152 lb (68.9 kg)   BMI 25.29 kg/m   Constitutional:  Alert and oriented, no acute distress, nontoxic appearing HEENT: [place], AT Cardiovascular: No clubbing, cyanosis, or edema Respiratory: Normal respiratory effort, no increased work of breathing GU: Circumcised penis Skin: No rashes, bruises or suspicious lesions Neurologic: Grossly intact, no focal deficits, moving all 4 extremities Psychiatric: Normal mood and affect  Laboratory Data: Results for orders placed or performed in visit on 04/10/20  Bladder Scan (Post Void Residual) in office  Result Value Ref Range   Scan Result 04/12/20    Assessment & Plan:   1. Urinary retention Recurrent retention today. I had a lengthy conversation with the patient and his son today regarding management options at this time including Foley catheter replacement versus CIC teaching. Son and patient agreed that patient would not CIC as instructed due to the absence of the sensation to void. Ultimately they agreed on Foley replacement instead; see separate procedure note for details.  Differential for recurrent retention at this time  remains BOO vs neurogenic bladder. Recommend cystoscopy and UDS for further evaluation. Patient is in agreement with this plan.  - Bladder Scan (Post Void Residual) in office - Ambulatory referral to Urology   Return in about 2 weeks (around 04/24/2020) for Cystoscopy with Dr. Lonna Cobb.  Carman Ching, PA-C  [facility] [address], [address] [place], [place] [address] [phone]

## 2020-04-10 NOTE — Telephone Encounter (Signed)
Alexander Harmon from [facility] called requesting verbal orders for physical therapy to help with high level balance and safety: One time a week for one week, Two times a week for three weeks, Then one time a week for one week.  I gave the okay for these orders.  Speech is going out today for their evaluation and will contact us for their verbal requests soon.  She also mentioned that there was a possible nursing order requested to assist with his catheter. He was seen by urology and had it removed on Friday and is following back up with them today.   If needed, Alexander Harmon's call back number is [phone].

## 2020-04-10 NOTE — Patient Instructions (Signed)
Nice to see you.  We will get lab work today. I will contact you when I hear back from Dr. Marcell Barlow. Please let me know which type of Accu-Chek strips you use. Please complete one visit of occupational therapy to see if they have anything to offer.  You will continue PT and speech therapy through home health.

## 2020-04-10 NOTE — Progress Notes (Signed)
Alexander Rumps, MD Phone: [phone]  Alexander Harmon is a 86 y.o. male who presents today to establish care.  Head injury: This patient was hospitalized at [facility] in [place] following a head injury.  He was found to have a closed fracture of the temporal bone, unspecified fracture of the occiput, traumatic subdural hematoma, and subarachnoid hemorrhage.  He suffered from a concussion and also has had hearing loss in his left ear.  They additionally noted urinary retention.  He seems to have done relatively well since discharge.  He has seen sports medicine for follow-up of his head injury and has also seen neurosurgery.  He was diagnosed with postconcussive syndrome and neurosurgery referred to occupational therapy.  He has also been set up with home health which has recommended PT and speech therapy for 3 to 4 weeks.  He is walking okay though does have to steady himself and move slowly.  He has had ongoing issues with dizziness that were occurring prior to him falling for which he would take meclizine.  He has seen ENT for follow-up on his temporal bone fracture and hearing loss.  His son notes that he was advised that he would need a follow-up CT scan 1 month after the injury though one has not been scheduled yet.  Diabetes: Home glucose has been running 94-155.  Dr. Georgina Snell started him back on glimepiride 2 mg once daily as his blood glucose was running higher than that previously.  He is additionally on Metformin and Actos.  He denies polyuria and polydipsia.  Also denies hypoglycemia.  Hypertension: They do not check blood pressures at home.  He is on lisinopril 40 mg once daily.  No chest pain, shortness of breath, or edema.  Urinary retention: He has been seeing urology for this.  He had a timed voiding trial over the weekend and notes he would urinate a very small amount every 2 hours.  He notes no pain related to this.  He sees urology later today for evaluation.  Active  Ambulatory Problems    Diagnosis Date Noted  . Essential hypertension 03/29/2020  . Diabetes ([place]) 03/29/2020  . Head injury 04/10/2020  . Dizziness 04/10/2020  . Subarachnoid hemorrhage ([place]) 04/10/2020  . Closed fracture of temporal bone ([place]) 04/10/2020  . Subdural hematoma ([place]) 04/10/2020  . Urinary retention 04/10/2020   Resolved Ambulatory Problems    Diagnosis Date Noted  . No Resolved Ambulatory Problems   No Additional Past Medical History    No family history on file.  Social History   Socioeconomic History  . Marital status: Unknown    Spouse name: Not on file  . Number of children: Not on file  . Years of education: Not on file  . Highest education level: Not on file  Occupational History  . Not on file  Tobacco Use  . Smoking status: Never Smoker  . Smokeless tobacco: Never Used  Substance and Sexual Activity  . Alcohol use: Not on file  . Drug use: Not on file  . Sexual activity: Not on file  Other Topics Concern  . Not on file  Social History Narrative  . Not on file   Social Determinants of Health   Financial Resource Strain:   . Difficulty of Paying Living Expenses: Not on file  Food Insecurity:   . Worried About Charity fundraiser in the Last Year: Not on file  . Ran Out of Food in the Last Year: Not on file  Transportation Needs:   . Film/video editor (Medical): Not on file  . Lack of Transportation (Non-Medical): Not on file  Physical Activity:   . Days of Exercise per Week: Not on file  . Minutes of Exercise per Session: Not on file  Stress:   . Feeling of Stress : Not on file  Social Connections:   . Frequency of Communication with Friends and Family: Not on file  . Frequency of Social Gatherings with Friends and Family: Not on file  . Attends Religious Services: Not on file  . Active Member of Clubs or Organizations: Not on file  . Attends Archivist Meetings: Not on file  . Marital Status: Not on file  Intimate  Partner Violence:   . Fear of Current or Ex-Partner: Not on file  . Emotionally Abused: Not on file  . Physically Abused: Not on file  . Sexually Abused: Not on file    ROS See HPI  Objective  Physical Exam Vitals:   04/10/20 0818  BP: 130/70  Pulse: 73  Temp: 99.1 F (37.3 C)  SpO2: 98%    BP Readings from Last 3 Encounters:  04/10/20 120/71  04/10/20 130/70  04/07/20 120/66   Wt Readings from Last 3 Encounters:  04/10/20 152 lb (68.9 kg)  04/10/20 151 lb 12.8 oz (68.9 kg)  04/06/20 150 lb (68 kg)    Physical Exam Constitutional:      General: He is not in acute distress.    Appearance: He is not diaphoretic.  HENT:     Head: Normocephalic and atraumatic.     Right Ear: Tympanic membrane normal. No hemotympanum.     Left Ear: No hemotympanum.     Ears:     Comments: Left TM with perforation noted Eyes:     Extraocular Movements: Extraocular movements intact.     Conjunctiva/sclera: Conjunctivae normal.     Pupils: Pupils are equal, round, and reactive to light.  Cardiovascular:     Rate and Rhythm: Normal rate and regular rhythm.     Heart sounds: Normal heart sounds.  Pulmonary:     Effort: Pulmonary effort is normal.     Breath sounds: Normal breath sounds.  Abdominal:     General: Bowel sounds are normal. There is no distension.     Palpations: Abdomen is soft.     Tenderness: There is no abdominal tenderness. There is no guarding or rebound.  Musculoskeletal:     Right lower leg: No edema.     Left lower leg: No edema.  Skin:    General: Skin is warm and dry.  Neurological:     Mental Status: He is alert.     Comments: Sensation intact to light touch V1 through V3 bilaterally, shoulder shrug intact, hearing diminished to conversational tones, 5/5 strength in bilateral biceps, triceps, grip, quads, hamstrings, plantar and dorsiflexion, sensation to light touch intact in bilateral UE and LE, normal gait      Assessment/Plan:   Problem List  Items Addressed This Visit    Closed fracture of temporal bone ([place])    This likely has contributed to his left sided hearing loss.  He will continue to see ENT for this.      Diabetes ([place]) - Primary    Uncontrolled with an A1c of 8.0.  Discussed that glimepiride and Actos would not be my first choices for diabetic management.  Discussed the potential for a once weekly injection that is not an insulin such  as Ozempic though the patient declined any injectable therapy.  Advised that I would need to see what his kidney function reveals prior to making any recommendations for medication changes.  For now he will continue Metformin 500 mg twice daily, Actos 30 mg daily, and glimepiride 2 mg once daily.  Discussed the risk of hypoglycemia with glimepiride and cardiac issues with the Actos.      Dizziness    Chronic issue.  He is currently seeing ENT and they may be able to help determine a more specific cause.  Meclizine sent in to use as needed as he has been using this over-the-counter for some time now.      Relevant Medications   meclizine (ANTIVERT) 25 MG tablet   Essential hypertension    Adequate control for age.  He will continue lisinopril 40 mg once daily.  Check labs.  Addendum: Lab work returned with elevated creatinine and potassium.  See phone note for documentation.  Patient noted no palpitations or muscle weakness.  I messaged with urology via secure chat and they placed a Foley catheter earlier today for urinary retention.  I suspect that has contributed to his increased creatinine and that his creatinine will improve over the next several days with the Foley in place.  I suspect the lisinopril is playing a role in combination with urinary retention to cause his hyperkalemia.  We will have him discontinue his lisinopril and recheck labs on Tuesday 11/16.  The patient's son was advised to have him go to the emergency department if you develop muscle weakness, chest pain, or  palpitations.  See phone note for documentation.      Relevant Orders   Comp Met (CMET) (Completed)   Lipid panel (Completed)   Head injury    Patient is doing relatively well following his prior head injury.  He has established care with the appropriate specialists and he will continue to see neurosurgery, ENT, and sports medicine.  His son notes at some point they were advised he needed to see a neurologist on not sure there is a specific reason for him to do so at this time.  I encouraged him to participate in physical therapy and speech therapy.  They question the need for occupational therapy and I suggested that they see them at least once to see if they have anything to offer.  I will send a message to the patient's neurosurgeon to see what they think about a follow-up CT scan.      Relevant Orders   CBC (Completed)   Subarachnoid hemorrhage (HCC)    Related to head injury.  Will see if neurosurgery wants to proceed with follow-up CT imaging at 1 month.      Subdural hematoma (HCC)    From head injury.  We will see if neurosurgery would like to proceed with follow-up CT imaging at 1 month post injury.      Urinary retention    He will continue to see urology for this.       Other Visit Diagnoses    Need for immunization against influenza       Relevant Orders   Flu Vaccine QUAD High Dose(Fluad) (Completed)      This visit occurred during the SARS-CoV-2 public health emergency.  Safety protocols were in place, including screening questions prior to the visit, additional usage of staff PPE, and extensive cleaning of exam room while observing appropriate contact time as indicated for disinfecting solutions.    Randall Hiss  Caryl Bis, MD [place]

## 2020-04-10 NOTE — Assessment & Plan Note (Signed)
He will continue to see urology for this.

## 2020-04-10 NOTE — Telephone Encounter (Signed)
Patient called in with the name of machine he is using accu-chek

## 2020-04-10 NOTE — Telephone Encounter (Signed)
noted 

## 2020-04-10 NOTE — Telephone Encounter (Signed)
Noted.  Accu-Chek strips sent to pharmacy.  Please let the son know that I heard back from urology.  They noted the expected his kidney function to improve over the next several days as he now has a catheter in place.  Given that he has the known urinary retention that is now being treated with a catheter I think it is reasonable to monitor him with outpatient labs and that he does not have to go to the emergency room unless he develops symptoms or if his lab work worsens.  I would like to get follow-up lab work on Tuesday to recheck his kidney function and potassium level.  I have placed orders for this.  If he develops any palpitations, chest pain, or muscle weakness he needs to go to the emergency department.

## 2020-04-10 NOTE — Telephone Encounter (Signed)
I called and spoke with the patient's sone and informed him of his labs . He stated patient has not complained of palpitations or muscle weakness.  He did have a catheter placed today.  He was informed if he has those symptoms to take him to the ED for further evaluation and to stop the lisinopril.  I also informed him that the provider was waiting on a response from the Urologist and we will call him back.  Willy Pinkerton,cma

## 2020-04-10 NOTE — Assessment & Plan Note (Signed)
Chronic issue.  He is currently seeing ENT and they may be able to help determine a more specific cause.  Meclizine sent in to use as needed as he has been using this over-the-counter for some time now.

## 2020-04-11 ENCOUNTER — Other Ambulatory Visit: Payer: Self-pay | Admitting: Family Medicine

## 2020-04-11 ENCOUNTER — Encounter: Payer: Self-pay | Admitting: Family Medicine

## 2020-04-11 ENCOUNTER — Other Ambulatory Visit: Payer: Medicare Other

## 2020-04-11 ENCOUNTER — Ambulatory Visit: Payer: Medicare Other | Admitting: Family Medicine

## 2020-04-11 ENCOUNTER — Telehealth: Payer: Self-pay | Admitting: Family Medicine

## 2020-04-11 ENCOUNTER — Telehealth: Payer: Self-pay

## 2020-04-11 VITALS — BP 128/74 | HR 80 | Ht 65.0 in | Wt 150.0 lb

## 2020-04-11 DIAGNOSIS — R339 Retention of urine, unspecified: Secondary | ICD-10-CM

## 2020-04-11 DIAGNOSIS — E875 Hyperkalemia: Secondary | ICD-10-CM

## 2020-04-11 DIAGNOSIS — H9193 Unspecified hearing loss, bilateral: Secondary | ICD-10-CM

## 2020-04-11 DIAGNOSIS — N179 Acute kidney failure, unspecified: Secondary | ICD-10-CM | POA: Diagnosis not present

## 2020-04-11 DIAGNOSIS — S065XAA Traumatic subdural hemorrhage with loss of consciousness status unknown, initial encounter: Secondary | ICD-10-CM

## 2020-04-11 DIAGNOSIS — I609 Nontraumatic subarachnoid hemorrhage, unspecified: Secondary | ICD-10-CM | POA: Diagnosis not present

## 2020-04-11 DIAGNOSIS — S0219XD Other fracture of base of skull, subsequent encounter for fracture with routine healing: Secondary | ICD-10-CM

## 2020-04-11 DIAGNOSIS — Z960 Presence of urogenital implants: Secondary | ICD-10-CM

## 2020-04-11 DIAGNOSIS — S065X9A Traumatic subdural hemorrhage with loss of consciousness of unspecified duration, initial encounter: Secondary | ICD-10-CM | POA: Diagnosis not present

## 2020-04-11 LAB — BASIC METABOLIC PANEL
BUN: 36 mg/dL — ABNORMAL HIGH (ref 6–23)
CO2: 23 mEq/L (ref 19–32)
Calcium: 9.8 mg/dL (ref 8.4–10.5)
Chloride: 101 mEq/L (ref 96–112)
Creatinine, Ser: 1.72 mg/dL — ABNORMAL HIGH (ref 0.40–1.50)
GFR: 36.2 mL/min — ABNORMAL LOW (ref >60.00)
Glucose, Bld: 277 mg/dL — ABNORMAL HIGH (ref 70–99)
Potassium: 6.2 mEq/L (ref 3.5–5.1)
Sodium: 131 mEq/L — ABNORMAL LOW (ref 135–145)

## 2020-04-11 MED ORDER — SODIUM POLYSTYRENE SULFONATE 15 GM/60ML PO SUSP: 15.0000 g | Freq: Once | ORAL | 0 refills | Status: AC

## 2020-04-11 NOTE — Telephone Encounter (Signed)
CRITICAL VALUE STICKER  CRITICAL VALUE: 6.2 Potassium   RECEIVER (on-site recipient of call): Inmer Nix  DATE & TIME NOTIFIED: 04/11/20; 12:53  MESSENGER (representative from lab): Autry.Rack  MD NOTIFIED: Antony Haste  TIME OF NOTIFICATION: 1:00 pm  RESPONSE:

## 2020-04-11 NOTE — Patient Instructions (Addendum)
Thank you for coming in today.  Please get labs today before you leave.  Continue PT and Diabetes medicine.   Plan for CT scan of the head at the end of the month. Get a CD made of the head CT images.  Bring it with you to the [facility].   Let me know if you do not hear about CT scheduling soon.   Recheck with me as needed.

## 2020-04-11 NOTE — Telephone Encounter (Signed)
See result note.  

## 2020-04-11 NOTE — Telephone Encounter (Signed)
Please let the patient's son know that I heard back from Dr. Myer Haff.  He did not feel that a CT scan was indicated for follow-up as it would not change his management of the patient.  They should follow-up with him as scheduled.

## 2020-04-12 ENCOUNTER — Other Ambulatory Visit: Payer: Self-pay

## 2020-04-12 ENCOUNTER — Other Ambulatory Visit (INDEPENDENT_AMBULATORY_CARE_PROVIDER_SITE_OTHER): Payer: Medicare Other

## 2020-04-12 ENCOUNTER — Other Ambulatory Visit: Payer: Self-pay | Admitting: Family Medicine

## 2020-04-12 DIAGNOSIS — N179 Acute kidney failure, unspecified: Secondary | ICD-10-CM

## 2020-04-12 DIAGNOSIS — E875 Hyperkalemia: Secondary | ICD-10-CM | POA: Diagnosis not present

## 2020-04-12 LAB — BASIC METABOLIC PANEL
BUN: 27 mg/dL — ABNORMAL HIGH (ref 6–23)
CO2: 25 mEq/L (ref 19–32)
Calcium: 9.3 mg/dL (ref 8.4–10.5)
Chloride: 97 mEq/L (ref 96–112)
Creatinine, Ser: 1.32 mg/dL (ref 0.40–1.50)
GFR: 49.74 mL/min — ABNORMAL LOW (ref >60.00)
Glucose, Bld: 277 mg/dL — ABNORMAL HIGH (ref 70–99)
Potassium: 5.4 mEq/L — ABNORMAL HIGH (ref 3.5–5.1)
Sodium: 128 mEq/L — ABNORMAL LOW (ref 135–145)

## 2020-04-12 NOTE — Telephone Encounter (Signed)
Patient notified and called and will recheck.  Laster Appling,cma

## 2020-04-12 NOTE — Telephone Encounter (Signed)
Called the patient on yesterday and informed him of the follow up with Dr. Rosanne Gutting.  Shadow Stiggers,cma

## 2020-04-13 ENCOUNTER — Ambulatory Visit: Payer: Medicare Other | Attending: Neurosurgery | Admitting: Occupational Therapy

## 2020-04-13 ENCOUNTER — Other Ambulatory Visit: Payer: Self-pay

## 2020-04-13 ENCOUNTER — Encounter: Payer: Self-pay | Admitting: Occupational Therapy

## 2020-04-13 DIAGNOSIS — R41841 Cognitive communication deficit: Secondary | ICD-10-CM

## 2020-04-13 NOTE — Therapy (Signed)
[place] [facility] MAIN [facility] SERVICES [address] [place], [place], [address] Phone: [phone]   Fax:  [phone]  Occupational Therapy Evaluation  Patient Details  Name: Alexander Harmon MRN: [id number] Date of Birth: 1933-12-31 Referring Provider (OT): Herb Grays   Encounter Date: 04/13/2020   OT End of Session - 04/13/20 1630    Visit Number 1    Number of Visits 1    Date for OT Re-Evaluation 04/13/21    OT Start Time 1030    OT Stop Time 1125    OT Time Calculation (min) 55 min    Activity Tolerance Patient tolerated treatment well           Past Medical History:  Diagnosis Date  . Diabetes (HCC) 03/29/2020  . Essential hypertension 03/29/2020    History reviewed. No pertinent surgical history.  There were no vitals filed for this visit.   Subjective Assessment - 04/13/20 1420    Subjective  Pt. was present with his son.    Pertinent History Pt. is an 86 y.o. male who was hospitalized with a TBI sustained during a fall down outddoor stairs while at a wedding in [place]. Pt. resides in [place]. Pt.'s son resides in the upstairs of the home. Pt. resides downstairs. Pt. is staying with his son who resides locally since being discharged from the hospital. Pt. is currently receiving PT, and ST through home health services. Pt. currently has a foley catheter secondary urinary retention.    Currently in Pain? No/denies             [facility] OT Assessment - 04/13/20 1037      Assessment   Medical Diagnosis TBI     Referring Provider (OT) Marcell Barlow    Onset Date/Surgical Date 03/17/20    Hand Dominance Right      Precautions   Precautions None      Restrictions   Weight Bearing Restrictions No      Balance Screen   Has the patient fallen in the past 6 months No    Has the patient had a decrease in activity level because of a fear of falling?  No    Is the patient reluctant to leave their home because of a fear of  falling?  No      Home  Environment   Family/patient expects to be discharged to: Private residence    Living Arrangements Alone    Available Help at Discharge Family    Type of Home House    Home Access Level entry    Home Layout Two level    Bathroom Shower/Tub Tub/Shower unit;Curtain    Home Equipment Grab bars - toilet    Lives With Alone      Prior Function   Level of Independence Independent    Vocation Retired    Archivist, going out to eat, travel      ADL   Eating/Feeding Independent    Grooming Independent    Agricultural consultant Independent      IADL   Prior Level of Function Shopping Independent    Shopping Needs to be accompanied on any shopping trip    Prior Level of Function Light Housekeeping Independent    Light Housekeeping --  Has not resumed as he is staying with his son   Prior Level of Function Meal Prep Independent    Meal Prep Able to complete simple cold meal and snack prep    Prior Level of Function Best boy Relies on family or friends for transportation    Prior Level of Function Medication Managment Independent    Medication Management --   assist with pillbox set-up/ encouragement to use a pillbox   Prior Level of Function Financial Management Independent    Financial Management --   Has resumed daily finances, and investing     Written Expression   Dominant Hand Right    Handwriting 90% legible      Vision - History   Baseline Vision --   No change since prior to the accident     Cognition   Overall Cognitive Status Impaired/Different from baseline    Memory Impaired    Awareness Impaired    Cognition Comments Impaired Insight       Observation/Other Assessments   Focus on Therapeutic Outcomes (FOTO)   81.8      Sensation   Light Touch Appears Intact      Coordination   Gross Motor Movements are Fluid and Coordinated Yes    Fine Motor Movements are Fluid and Coordinated Yes    Right 9 Hole Peg Test 27 sec.    Left 9 Hole Peg Test 27 sec.      Strength   Overall Strength Comments BUE strength  5/5 overall      Hand Function   Right Hand Grip (lbs) 55    Right Hand Lateral Pinch 17 lbs    Right Hand 3 Point Pinch 15 lbs    Left Hand Grip (lbs) 58    Left Hand Lateral Pinch 15 lbs    Left 3 point pinch 14 lbs                      OT Education - 04/13/20 1629    Education Details OT services, cognitive assessment with driver rehabilitation services    Person(s) Educated Patient    Methods Explanation;Demonstration    Comprehension Verbalized understanding                      Plan - 04/13/20 1632    Clinical Impression Statement Pt. is an 86 y.o. male who sustained a TBI during a fall down steps while [place] a [place] in [place]. Pt. has resumed performing ADL tasks including bathing, dressing, toileting, showering independently. FOTO score 81.8. Pt. is staying with his son currently as he has not yet fully resumed IADL tasks including laundry, bedmaking, house cleaning, and light meal preparation. Pt.'s son reports that he has been able  to manage shopping for items while accompanied. Pt. has not resumed driving. Pt. has resumed computer, and cellphone use. Pt.is currently using a pillbox, however requires assist from his family for accuracy with performing the set-up of the weekly pillbox. Pt. was adamant that he would prefer to use his travel cup to hold all the medications together instead of separated out into a weekly Journalist, newspaper. Pt. reports that he has resumed with, and  is accurately able to manage his personal finances. Pt. does report that he has resumed trading stocks over the past few weeks. Pt. reports that since resuming them he has made some  poor investment decisions. The patient's son reports  that he had requested that the patient wait before resuming investment trading. Pt.s BUE strength, ROM, and FMC skills are all WNL.  Pt. caregiver education was provided about resuming home management tasks, and light meal preparation in a supervised environment secondary to decreased insight into his residual affects of the TBI affecting safety awareness, and judgement. It is recommended that the pt. have a formal cognitive driving assessment through a driver rehabilitation program once cleared by the physician prior to resuming driving. Additional OT skilled services are not indicated at this time. No further treatment plan, or OT goals are indicated at this level of care.    Occupational performance deficits (Please refer to evaluation for details): ADL's;IADL's    Cognitive Skills Memory;Safety Awareness    Rehab Potential --           Patient will benefit from skilled therapeutic intervention in order to improve the following deficits and impairments:     Cognitive Skills: Memory, Safety Awareness     Visit Diagnosis: Cognitive communication deficit    Problem List Patient Active Problem List   Diagnosis Date Noted  . Head injury 04/10/2020  . Dizziness 04/10/2020  . Subarachnoid hemorrhage (HCC) 04/10/2020  . Closed fracture of temporal bone (HCC) 04/10/2020  . Subdural hematoma (HCC) 04/10/2020  . Urinary retention 04/10/2020  . Essential hypertension 03/29/2020  . Diabetes (HCC) 03/29/2020    Olegario Messier, MS, OTR/L 04/13/2020, 5:48 PM  [place] [facility] MAIN [facility] SERVICES [address] [place], [place], [address] Phone: [phone]   Fax:  [phone]  Name: Kyreese Chio MRN: [id number] Date of Birth: Dec 01, 1933

## 2020-04-14 ENCOUNTER — Other Ambulatory Visit (INDEPENDENT_AMBULATORY_CARE_PROVIDER_SITE_OTHER): Payer: Medicare Other

## 2020-04-14 ENCOUNTER — Other Ambulatory Visit: Payer: Self-pay

## 2020-04-14 DIAGNOSIS — N179 Acute kidney failure, unspecified: Secondary | ICD-10-CM | POA: Diagnosis not present

## 2020-04-14 LAB — BASIC METABOLIC PANEL
BUN: 24 mg/dL — ABNORMAL HIGH (ref 6–23)
CO2: 25 mEq/L (ref 19–32)
Calcium: 9.2 mg/dL (ref 8.4–10.5)
Chloride: 98 mEq/L (ref 96–112)
Creatinine, Ser: 1.25 mg/dL (ref 0.40–1.50)
GFR: 53.09 mL/min — ABNORMAL LOW (ref >60.00)
Glucose, Bld: 271 mg/dL — ABNORMAL HIGH (ref 70–99)
Potassium: 5 mEq/L (ref 3.5–5.1)
Sodium: 130 mEq/L — ABNORMAL LOW (ref 135–145)

## 2020-04-18 ENCOUNTER — Encounter: Payer: Medicare Other | Admitting: Occupational Therapy

## 2020-04-19 ENCOUNTER — Ambulatory Visit
Admission: RE | Admit: 2020-04-19 | Discharge: 2020-04-19 | Disposition: A | Discharge status: Home / Self Care | Payer: Medicare Other | Source: Ambulatory Visit | Attending: Unknown Physician Specialty | Admitting: Unknown Physician Specialty

## 2020-04-19 ENCOUNTER — Other Ambulatory Visit: Payer: Self-pay

## 2020-04-19 ENCOUNTER — Telehealth: Payer: Self-pay | Admitting: Family Medicine

## 2020-04-19 DIAGNOSIS — S0219XA Other fracture of base of skull, initial encounter for closed fracture: Secondary | ICD-10-CM | POA: Diagnosis not present

## 2020-04-19 NOTE — Telephone Encounter (Signed)
Boneta Lucks from [facility] called stating that the patient will be discharged from PT and Speech Therapy.  Just wanted to let Dr Denyse Amass know.

## 2020-04-24 NOTE — Telephone Encounter (Signed)
Aware. 

## 2020-04-25 ENCOUNTER — Encounter: Payer: Medicare Other | Admitting: Occupational Therapy

## 2020-04-27 ENCOUNTER — Encounter: Payer: Medicare Other | Admitting: Occupational Therapy

## 2020-04-28 ENCOUNTER — Other Ambulatory Visit: Payer: Self-pay | Admitting: Urology

## 2020-05-01 ENCOUNTER — Other Ambulatory Visit: Payer: Self-pay | Admitting: Family Medicine

## 2020-05-02 ENCOUNTER — Telehealth: Payer: Self-pay

## 2020-05-02 ENCOUNTER — Encounter: Payer: Medicare Other | Admitting: Occupational Therapy

## 2020-05-02 ENCOUNTER — Other Ambulatory Visit: Payer: Self-pay

## 2020-05-02 NOTE — Telephone Encounter (Signed)
Pt's son called and states that he wants an explanation of why lovastatin was discontinued. Please advise

## 2020-05-02 NOTE — Telephone Encounter (Signed)
Patient is asking why the medication Lovastatin was discontinued and i'm not sure if I did the discontinue at his visit if it was a mistake or not, his labs said nothing about it, can you look and see if he still needs to be on this medication or not.  Alexander Harmon,cma

## 2020-05-03 ENCOUNTER — Ambulatory Visit (INDEPENDENT_AMBULATORY_CARE_PROVIDER_SITE_OTHER): Payer: Medicare Other | Admitting: Urology

## 2020-05-03 ENCOUNTER — Ambulatory Visit: Payer: Medicare Other | Admitting: Urology

## 2020-05-03 ENCOUNTER — Encounter: Payer: Self-pay | Admitting: Urology

## 2020-05-03 ENCOUNTER — Other Ambulatory Visit: Payer: Self-pay

## 2020-05-03 VITALS — BP 153/73 | HR 77 | Ht 65.0 in | Wt 152.0 lb

## 2020-05-03 DIAGNOSIS — R310 Gross hematuria: Secondary | ICD-10-CM

## 2020-05-03 DIAGNOSIS — R339 Retention of urine, unspecified: Secondary | ICD-10-CM

## 2020-05-03 LAB — BLADDER SCAN AMB NON-IMAGING

## 2020-05-03 NOTE — Progress Notes (Signed)
05/03/2020 1:22 PM   Alexander Harmon 12/27/1933 [id number]  Referring provider: Glori Luis, MD [address] [place],  [place] [address]  Chief Complaint  Patient presents with  . Urinary Retention   Urological history 1. Urinary retention - failed multiple TOV's  - on tamsulosin 0.4 mg daily - Urodynamic study performed at [facility] 04/25/2020 remarkable for bladder capacity of 242 mL.  Hypersensitivity and instability was noted he was able to void 186 mL with a peak flow rate of 12 cc/s; PVR 56 mL.  He was able to generate a sustained detrusor contraction which was equivocal for obstruction - cystoscopy on 05/03/2020 with Dr. Lonna Cobb mild lateral lobe enlargement prostate and mild elevation bladder neck; small median lobe   HPI: Alexander Harmon is an 86 y.o. male who had his catheter removed this morning after his cystoscopy for a TOV who presents this afternoon for a PVR with his son, [name].  His PVR was found to be 269 mL.    Mr. Rivere is not wanting a catheter to be replaced, but his son is concerned that with his moderate residual he will go into retention in the near future.    After discussion, Mr. Habig has agreed to have a Foley placed as long as we have him scheduled for UroLift as soon as the next date is available.  PMH: Past Medical History:  Diagnosis Date  . Diabetes (HCC) 03/29/2020  . Essential hypertension 03/29/2020    Surgical History: No past surgical history on file.  Home Medications:  Allergies as of 05/03/2020   No Known Allergies     Medication List       Accurate as of May 03, 2020 11:59 PM. If you have any questions, ask your nurse or doctor.        acetaminophen 500 MG tablet Commonly known as: TYLENOL Take 500 mg by mouth every 6 (six) hours as needed.   glimepiride 2 MG tablet Commonly known as: Amaryl Take 1 tablet (2 mg total) by mouth daily with breakfast.   glucose blood test strip Use  once daily to check blood glucose, dispense Accu-Chek glucose strips, diagnosis code E11.9   meclizine 25 MG tablet Commonly known as: ANTIVERT Take 1 tablet (25 mg total) by mouth 3 (three) times daily as needed for dizziness.   metFORMIN 500 MG tablet Commonly known as: GLUCOPHAGE Take 500 mg by mouth 2 (two) times daily with a meal.   pioglitazone 30 MG tablet Commonly known as: ACTOS Take 30 mg by mouth daily.   tamsulosin 0.4 MG Caps capsule Commonly known as: FLOMAX TAKE 1 CAPSULE BY MOUTH EVERY NIGHT AT BEDTIME       Allergies:  Allergies  Allergen Reactions  . Other     general anesthesia - pt told not to be giving general anesthesia because had a traumatic brain injury 03/17/20    Family History: No family history on file.  Social History:  reports that he has never smoked. He has never used smokeless tobacco. No history on file for alcohol use and drug use.  ROS: Pertinent ROS in HPI  Physical Exam: There were no vitals taken for this visit.  Constitutional:  Well nourished. Alert and oriented, No acute distress. HEENT: [place] AT, moist mucus membranes.  Trachea midline, no masses. Cardiovascular: No clubbing, cyanosis, or edema. Respiratory: Normal respiratory effort, no increased work of breathing. Neurologic: Grossly intact, no focal deficits, moving all 4 extremities. Psychiatric:  Normal mood and affect.  Laboratory Data: Lab Results  Component Value Date   WBC 5.8 04/10/2020   HGB 10.9 (L) 04/10/2020   HCT 32.4 (L) 04/10/2020   MCV 90.0 04/10/2020   PLT 314.0 04/10/2020       Component Value Date/Time   CHOL 162 04/10/2020 0848   HDL 44.10 04/10/2020 0848   CHOLHDL 4 04/10/2020 0848   VLDL 27.8 04/10/2020 0848   LDLCALC 90 04/10/2020 0848    Lab Results  Component Value Date   AST 16 04/10/2020   Lab Results  Component Value Date   ALT 12 04/10/2020   I have reviewed the labs.   Pertinent Imaging: Results for ADHAM, JOHNSON  (MRN [id number]) as of 05/17/2020 12:47  Ref. Range 05/03/2020 15:26  Scan Result Unknown   I have independently reviewed the films.    Simple Catheter Placement Due to urinary retention patient is present today for a foley cath placement.  Patient was cleaned and prepped in a sterile fashion with betadine. An 18 FR foley catheter was inserted, urine return was noted  300 ml, urine was yellow clear in color.  The balloon was filled with 10cc of sterile water.  A leg bag was attached for drainage. Patient was also given a night bag to take home and was given instruction on how to change from one bag to another.  Patient was given instruction on proper catheter care.  Patient tolerated well, no complications were noted   Performed by: Michiel Cowboy, PA-C  Assessment & Plan:    1. Urinary retention - Bladder Scan (Post Void Residual) in office - Foley in place - Patient is wanting to procede with UroLift - We will schedule UroLift for the patient at the next available procedure day.  The patient states that he has read the literature and wishes to proceed. - We will obtain cardiac clearance prior to procedure   Return for return for UroLift procedure.  These notes generated with voice recognition software. I apologize for typographical errors.  Michiel Cowboy, PA-C  [facility] [address]  [address] [place], [place] [address] [phone]  I spent 30 minutes on the day of the encounter to include pre-visit record review, face-to-face time with the patient, and post-visit ordering of tests.

## 2020-05-03 NOTE — Telephone Encounter (Signed)
I spoke with the patient's son, [name]. [name] states that his dad has started having memory issues and its concerning, he stated for 10 mins on yesterday he did not know where he was he thought he was in [place] and was in [place].  He also stated dementia and alzheimer's runs in the patient's family and he was asking if there is any medication that could be prescribed to help with this even if there is no cure.  He stated the patient's other provider Dr. Timothy Lasso suggested that you do an assessment on the patient when he comes in next week. The son stated that if you approach the patient about his memory issues he will say he does not have any memory issues at all, but he does, I informed him that the provider knows how to handle this type of situation and will address at his visit, the son stated he just did not want to say anything in front of the patient next week because he has become combative. Shereena Berquist,cma

## 2020-05-03 NOTE — Telephone Encounter (Signed)
It appears that it may have been taken out of his chart inadvertently. He should remain on this. If he needs a refill please let me know.

## 2020-05-03 NOTE — H&P (View-Only) (Signed)
   05/03/20  CC:  Chief Complaint  Patient presents with  . Cysto    HPI: 85 y.o. male with recurrent urinary retention   Refer to my initial note dated 03/31/2020  Has failed voiding trials x2  Urodynamic study performed at [facility] 04/25/2020 remarkable for bladder capacity of 242 mL.  Hypersensitivity and instability was noted he was able to void 186 mL with a peak flow rate of 12 cc/s; PVR 56 mL.  He was able to generate a sustained detrusor contraction which was equivocal for obstruction  There were no vitals taken for this visit. NED. A&Ox3.   No respiratory distress   Abd soft, NT, ND Normal phallus with bilateral descended testicles  Cystoscopy Procedure Note  Patient identification was confirmed, informed consent was obtained, and patient was prepped using Betadine solution.  Lidocaine jelly was administered per urethral meatus.     Pre-Procedure: - Inspection reveals a normal caliber urethral meatus.  Procedure: The flexible cystoscope was introduced without difficulty - No urethral strictures/lesions are present. - Mild lateral lobe enlargement prostate  - Mild elevation bladder neck; small median lobe - Bilateral ureteral orifices identified - Bladder mucosa  reveals inflammatory changes secondary to indwelling Foley.  No solid or papillary lesions identified - No bladder stones -Mild trabeculation  Retroflexion shows no intravesical median lobe   Post-Procedure: - Patient tolerated the procedure well  Assessment/ Plan:  Urinary retention; UDS equivocal for obstruction and patient was able to successfully void with residual 56 mL  Repeat voiding trial today; will follow up this afternoon for PVR bladder scan  Discussed UroLift for recurrent retention and he was provided literature   Fumio Vandam C Nikea Settle, MD  

## 2020-05-03 NOTE — Telephone Encounter (Signed)
Patient's son, [name] called to speak to [place]. Please call him at [phone], he has more questions.

## 2020-05-03 NOTE — Telephone Encounter (Signed)
Noted. We will discuss at his upcoming visit.

## 2020-05-03 NOTE — Progress Notes (Signed)
   05/03/20  CC:  Chief Complaint  Patient presents with  . Cysto    HPI: 86 y.o. male with recurrent urinary retention   Refer to my initial note dated 03/31/2020  Has failed voiding trials x2  Urodynamic study performed at [facility] 04/25/2020 remarkable for bladder capacity of 242 mL.  Hypersensitivity and instability was noted he was able to void 186 mL with a peak flow rate of 12 cc/s; PVR 56 mL.  He was able to generate a sustained detrusor contraction which was equivocal for obstruction  There were no vitals taken for this visit. NED. A&Ox3.   No respiratory distress   Abd soft, NT, ND Normal phallus with bilateral descended testicles  Cystoscopy Procedure Note  Patient identification was confirmed, informed consent was obtained, and patient was prepped using Betadine solution.  Lidocaine jelly was administered per urethral meatus.     Pre-Procedure: - Inspection reveals a normal caliber urethral meatus.  Procedure: The flexible cystoscope was introduced without difficulty - No urethral strictures/lesions are present. - Mild lateral lobe enlargement prostate  - Mild elevation bladder neck; small median lobe - Bilateral ureteral orifices identified - Bladder mucosa  reveals inflammatory changes secondary to indwelling Foley.  No solid or papillary lesions identified - No bladder stones -Mild trabeculation  Retroflexion shows no intravesical median lobe   Post-Procedure: - Patient tolerated the procedure well  Assessment/ Plan:  Urinary retention; UDS equivocal for obstruction and patient was able to successfully void with residual 56 mL  Repeat voiding trial today; will follow up this afternoon for PVR bladder scan  Discussed UroLift for recurrent retention and he was provided literature   [name], MD

## 2020-05-04 ENCOUNTER — Encounter: Payer: Medicare Other | Admitting: Occupational Therapy

## 2020-05-04 ENCOUNTER — Other Ambulatory Visit: Payer: Self-pay

## 2020-05-04 DIAGNOSIS — I609 Nontraumatic subarachnoid hemorrhage, unspecified: Secondary | ICD-10-CM

## 2020-05-04 MED ORDER — LOVASTATIN 40 MG PO TABS: 40.0000 mg | ORAL_TABLET | Freq: Every day | ORAL | 1 refills | Status: AC

## 2020-05-08 ENCOUNTER — Other Ambulatory Visit: Payer: Self-pay | Admitting: Radiology

## 2020-05-08 ENCOUNTER — Telehealth: Payer: Self-pay | Admitting: Cardiovascular Disease

## 2020-05-08 DIAGNOSIS — N401 Enlarged prostate with lower urinary tract symptoms: Secondary | ICD-10-CM

## 2020-05-08 DIAGNOSIS — Z951 Presence of aortocoronary bypass graft: Secondary | ICD-10-CM

## 2020-05-08 DIAGNOSIS — R339 Retention of urine, unspecified: Secondary | ICD-10-CM

## 2020-05-08 NOTE — Telephone Encounter (Signed)
   [facility] Pre-operative Risk Assessment    HEARTCARE STAFF: - Please ensure there is not already an duplicate clearance open for this procedure. - Under Visit Info/Reason for Call, type in Other and utilize the format Clearance MM/DD/YY or Clearance TBD. Do not use dashes or single digits. - If request is for dental extraction, please clarify the # of teeth to be extracted.  Request for surgical clearance:  1. What type of surgery is being performed? Cystoscopy with insertion of urolift   2. When is this surgery scheduled? 05/30/2020  3. What type of clearance is required (medical clearance vs. Pharmacy clearance to hold med vs. Both)? NOT LISTED  4. Are there any medications that need to be held prior to surgery and how long? NOT LISTED  5. Practice name and name of physician performing surgery? [facility]  What is the office phone number?  [phone]  7.   What is the office fax number? [phone] 8.   Anesthesia type (None, local, MAC, general) ? NOT LISTED   Elissa Hefty 05/08/2020, 12:18 PM  _________________________________________________________________   (provider comments below)

## 2020-05-09 ENCOUNTER — Ambulatory Visit: Payer: Medicare Other | Admitting: Cardiovascular Disease

## 2020-05-09 ENCOUNTER — Encounter: Payer: Self-pay | Admitting: Cardiovascular Disease

## 2020-05-09 ENCOUNTER — Encounter: Payer: Medicare Other | Admitting: Occupational Therapy

## 2020-05-09 ENCOUNTER — Other Ambulatory Visit: Payer: Self-pay

## 2020-05-09 VITALS — BP 126/66 | HR 62 | Ht 65.0 in | Wt 154.0 lb

## 2020-05-09 DIAGNOSIS — Z951 Presence of aortocoronary bypass graft: Secondary | ICD-10-CM | POA: Diagnosis not present

## 2020-05-09 DIAGNOSIS — S065X9A Traumatic subdural hemorrhage with loss of consciousness of unspecified duration, initial encounter: Secondary | ICD-10-CM

## 2020-05-09 DIAGNOSIS — I25118 Atherosclerotic heart disease of native coronary artery with other forms of angina pectoris: Secondary | ICD-10-CM

## 2020-05-09 DIAGNOSIS — I10 Essential (primary) hypertension: Secondary | ICD-10-CM

## 2020-05-09 DIAGNOSIS — E1169 Type 2 diabetes mellitus with other specified complication: Secondary | ICD-10-CM | POA: Diagnosis not present

## 2020-05-09 DIAGNOSIS — S065XAA Traumatic subdural hemorrhage with loss of consciousness status unknown, initial encounter: Secondary | ICD-10-CM

## 2020-05-09 NOTE — Telephone Encounter (Signed)
Dr. Billey Co are seeing this patient as a "new patient" today for preop clearance. If you would like assistance with faxing the clearance to the requesting party, I'm glad to help just let me know.

## 2020-05-09 NOTE — Progress Notes (Signed)
Cardiology Office Note  Date:  05/09/2020   ID:  Alexander Harmon, DOB May 08, 1934, MRN [id number]  PCP:  Alexander Luis, MD   Chief Complaint  Patient presents with  . New Patient (Initial Visit)    Establish care with provider and get surgical clearance. Medications verbally reviewed with patient and     HPI:  [name] is a 86 year old gentleman with past medical history of CAD Hx of CABG, 2002 in [place] Who presents by referral from Dr. 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 Most of the mastoid air cells are opacified by fluid. Fracture lines extend into the calvarium on the left in the squamous temporal bone and occipital bone regions.  Remote cardiac history discussed 2002: dizzy/lightheaded,cold Went to hospital Cath CABG Records requested   PMH:   has a past medical history of Diabetes (HCC) (03/29/2020) and Essential hypertension (03/29/2020).  PSH:   History reviewed. No pertinent surgical history.  Current Outpatient Medications  Medication Sig Dispense Refill  . acetaminophen (TYLENOL) 500 MG tablet Take 500 mg by mouth every 6 (six) hours as needed.    Marland Kitchen glimepiride (AMARYL) 2 MG tablet  Take 1 tablet (2 mg total) by mouth daily with breakfast. 60 tablet 1  . glucose blood test strip Use once daily to check blood glucose, dispense Accu-Chek glucose strips, diagnosis code E11.9 100 each 12  . lovastatin (MEVACOR) 40 MG tablet Take 1 tablet (40 mg total) by mouth at bedtime. 90 tablet 1  . meclizine (ANTIVERT) 25 MG tablet Take 1 tablet (25 mg total) by mouth 3 (three) times daily as needed for dizziness. 30 tablet 2  . metFORMIN (GLUCOPHAGE) 500 MG tablet Take by mouth 2 (two) times daily with a meal.    . pioglitazone (ACTOS) 30 MG tablet Take 30 mg by mouth daily.    . tamsulosin (FLOMAX) 0.4 MG CAPS capsule TAKE 1 CAPSULE BY MOUTH EVERY NIGHT AT BEDTIME 30 capsule 1   No current facility-administered medications for this visit.     Allergies:   Patient has no known allergies.   Social History:  The patient  reports that he has never smoked. He has never used smokeless tobacco.   Family History:   family history is not on file.    Review of Systems: Review of Systems  Constitutional: Negative.   HENT: Negative.   Respiratory: Negative.   Cardiovascular: Negative.   Gastrointestinal: Negative.   Musculoskeletal: Negative.   Neurological: Negative.   Psychiatric/Behavioral: Negative.   All other systems reviewed and are negative.    PHYSICAL EXAM: VS:  BP 126/66 (BP Location: Right Arm, Patient Position: Sitting, Cuff Size: Normal)   Pulse 62   Ht 5\' 5"  (1.651 m)   Wt  154 lb (69.9 kg)   SpO2 98%   BMI 25.63 kg/m  , BMI Body mass index is 25.63 kg/m. GEN: Well nourished, well developed, in no acute distress HEENT: normal Neck: no JVD, carotid bruits, or masses Cardiac: RRR; no murmurs, rubs, or gallops,no edema  Respiratory:  clear to auscultation bilaterally, normal work of breathing GI: soft, nontender, nondistended, + BS MS: no deformity or atrophy Skin: warm and dry, no rash Neuro:  Strength and sensation are intact Psych: euthymic mood, full  affect   Recent Labs: 04/10/2020: ALT 12; Hemoglobin 10.9; Platelets 314.0 04/14/2020: BUN 24; Creatinine, Ser 1.25; Potassium 5.0; Sodium 130    Lipid Panel Lab Results  Component Value Date   CHOL 162 04/10/2020   HDL 44.10 04/10/2020   LDLCALC 90 04/10/2020   TRIG 139.0 04/10/2020      Wt Readings from Last 3 Encounters:  05/09/20 154 lb (69.9 kg)  05/03/20 152 lb (68.9 kg)  04/11/20 150 lb (68 kg)      ASSESSMENT AND PLAN:  Problem List Items Addressed This Visit      Cardiology Problems   Essential hypertension   Relevant Orders   EKG 12-Lead     Other   Diabetes (HCC) - Primary   Subdural hematoma (HCC)    Other Visit Diagnoses    Coronary artery disease of native artery of native heart with stable angina pectoris (HCC)       Hx of CABG         Coronary artery disease with stable angina Remote history of CABG in [place] Records requested, no further intervention or cardiac work-up since that time Denies any anginal symptoms Active at baseline, able to achieve at least 4 METS Shopping frequently, gardening, other activities with no limitations Currently not on aspirin, will discuss with neurosurgery following urologic procedure -Goal LDL less than 70, recently restarted on lovastatin Heart rate low end of normal range, will hold off on adding beta-blocker at this time especially preoperatively  Preop cardiovascular valuation No further cardiac work-up needed at this time, acceptable risk for urologic procedure  Hyperlipidemia Back on lovastatin, goal LDL less than 70  Diabetes type 2 with complications Lifestyle modification recommended, Reports he is scheduled for repeat lab work through primary care Reports prior A1c less than 7  Details discussed with patient with patient's son who presents with him today, all questions answered, records requested from [facility] in [place], [place] in [place]  Total encounter time more than 60  minutes  Greater than 50% was spent in counseling and coordination of care with the patient    Signed, Alexander Harmon, M.D., Ph.D. [facility] [place], [place] [phone]

## 2020-05-09 NOTE — Patient Instructions (Addendum)
°  We will request old echo from 2021, stress test (few years ago) [facility] in [place] [facility] [place] , [place] Dr. Phylis Bougie ordered study   Medication Instructions:  No changes  If you need a refill on your cardiac medications before your next appointment, please call your pharmacy.    Lab work: No new labs needed   If you have labs (blood work) drawn today and your tests are completely normal, you will receive your results only by:  MyChart Message (if you have MyChart) OR  A paper copy in the mail If you have any lab test that is abnormal or we need to change your treatment, we will call you to review the results.   Testing/Procedures: No new testing needed   Follow-Up: At [facility], you and your health needs are our priority.  As part of our continuing mission to provide you with exceptional heart care, we have created designated Provider Care Teams.  These Care Teams include your primary Cardiologist (physician) and Advanced Practice Providers (APPs -  Physician Assistants and Nurse Practitioners) who all work together to provide you with the care you need, when you need it.   You will need a follow up appointment in 12 months   Providers on your designated Care Team:    Nicolasa Ducking, NP  Eula Listen, PA-C  Marisue Ivan, PA-C  Any Other Special Instructions Will Be Listed Below (If Applicable).  COVID-19 Vaccine Information can be found at: [email or web address] For questions related to vaccine distribution or appointments, please email [email or web address] or call [phone].

## 2020-05-10 ENCOUNTER — Other Ambulatory Visit: Payer: Self-pay

## 2020-05-10 NOTE — Telephone Encounter (Signed)
I have routed Dr. Windell Hummingbird office note from yesterday to Dr. Heywood Footman office. Removing this note from the preop pool. Tereso Newcomer, PA-C    05/10/2020 9:36 AM

## 2020-05-11 ENCOUNTER — Encounter: Payer: Self-pay | Admitting: Family Medicine

## 2020-05-11 ENCOUNTER — Ambulatory Visit: Payer: Medicare Other

## 2020-05-11 ENCOUNTER — Telehealth: Payer: Self-pay | Admitting: Family Medicine

## 2020-05-11 ENCOUNTER — Encounter: Payer: Medicare Other | Admitting: Occupational Therapy

## 2020-05-11 NOTE — Telephone Encounter (Signed)
I spoke with Alexander Harmon the patient's son his # is [phone]. He is the POA over his father. He has concerns regarding his father's memory. He would like to discuss this with the provider before his father's appointment on 05/12/20 @ 10:00. He stated he would like his father tested for short term memory loss if there is such a test. Per [name] his family has a history of memory loss. [name] stated that he has noticed that his father's memory loss has accelerated since the accident. His father has fired the physical, occupational and speech therapist. The speech therapist stated to the patient that he was walking with a hunch. The patient stated that he was not walking with a hunch and fired the physical therapist. The son is greatly concerned regarding this change in his father's memory and emotions.

## 2020-05-12 ENCOUNTER — Other Ambulatory Visit: Payer: Self-pay

## 2020-05-12 ENCOUNTER — Ambulatory Visit: Payer: Medicare Other | Admitting: Family Medicine

## 2020-05-12 ENCOUNTER — Encounter: Payer: Self-pay | Admitting: Family Medicine

## 2020-05-12 VITALS — BP 130/80 | HR 70 | Temp 98.4°F | Ht 65.0 in | Wt 154.0 lb

## 2020-05-12 DIAGNOSIS — E1169 Type 2 diabetes mellitus with other specified complication: Secondary | ICD-10-CM | POA: Diagnosis not present

## 2020-05-12 DIAGNOSIS — S0990XD Unspecified injury of head, subsequent encounter: Secondary | ICD-10-CM | POA: Diagnosis not present

## 2020-05-12 DIAGNOSIS — R339 Retention of urine, unspecified: Secondary | ICD-10-CM | POA: Diagnosis not present

## 2020-05-12 DIAGNOSIS — R413 Other amnesia: Secondary | ICD-10-CM

## 2020-05-12 DIAGNOSIS — S0219XA Other fracture of base of skull, initial encounter for closed fracture: Secondary | ICD-10-CM

## 2020-05-12 LAB — BASIC METABOLIC PANEL
BUN: 20 mg/dL (ref 6–23)
CO2: 23 mEq/L (ref 19–32)
Calcium: 9.5 mg/dL (ref 8.4–10.5)
Chloride: 103 mEq/L (ref 96–112)
Creatinine, Ser: 1.16 mg/dL (ref 0.40–1.50)
GFR: 58.04 mL/min — ABNORMAL LOW (ref >60.00)
Glucose, Bld: 293 mg/dL — ABNORMAL HIGH (ref 70–99)
Potassium: 4.9 mEq/L (ref 3.5–5.1)
Sodium: 138 mEq/L (ref 135–145)

## 2020-05-12 MED ORDER — EMPAGLIFLOZIN 10 MG PO TABS: 10.0000 mg | ORAL_TABLET | Freq: Every day | ORAL | 2 refills | Status: DC

## 2020-05-12 NOTE — Assessment & Plan Note (Signed)
Patient is going to have a UroLift procedure.  He will continue to follow with urology.

## 2020-05-12 NOTE — Progress Notes (Signed)
Marikay Alar, MD Phone: [phone]  Alexander Harmon is a 86 y.o. male who presents today for follow-up.  Head injury: Patient has done relatively well since his head injury previously.  He has followed up with the appropriate specialists.  Neurosurgery noted he did not need a follow-up CT scan.  He did have follow-up temporal bone CT with ENT.  Fracture was still present.  He did follow-up with ENT.  He does still have trouble hearing out of his left ear.  He is getting fit for hearing aids later today.  Memory difficulty: Patient's son reports memory difficulty regarding this patient.  I spoke with the patient's son today prior to the visit and he noted that the patient's short-term memory is off.  There was one occasion last week where the patient thought he was still in [place].  He has forgotten some simple electric things that he knew previously.  He also forgets what he has been told on occasion.  His son notes 95% of the time he is okay.  He does note a family history of dementia in the patient's sister as well as father and mother.  They do see neurology in January.  Diabetes: The patient reports his sugars have been running 100-150 though on the freestyle libre he has frequent sugars in the middle of the day up into the low 200s.  The patient focuses on his sugar having been in the 100-150 range previously and feels as though he does not need to check his blood sugar that frequently.  He is currently taking glimepiride 2 mg once daily, Actos 30 mg once daily, and Metformin 500 mg twice daily.  No increased thirst or urination.  No low blood sugars.  He does report having seen an eye doctor in the last year.  Social History   Tobacco Use  Smoking Status Never Smoker  Smokeless Tobacco Never Used     ROS see history of present illness  Objective  Physical Exam Vitals:   05/12/20 1002  BP: 130/80  Pulse: 70  Temp: 98.4 F (36.9 C)  SpO2: 99%    BP Readings from  Last 3 Encounters:  05/12/20 130/80  05/09/20 126/66  05/03/20 (!) 153/73   Wt Readings from Last 3 Encounters:  05/12/20 154 lb (69.9 kg)  05/09/20 154 lb (69.9 kg)  05/03/20 152 lb (68.9 kg)    Physical Exam Constitutional:      General: He is not in acute distress.    Appearance: He is not diaphoretic.  Cardiovascular:     Rate and Rhythm: Normal rate and regular rhythm.     Heart sounds: Normal heart sounds.  Pulmonary:     Effort: Pulmonary effort is normal.     Breath sounds: Normal breath sounds.  Musculoskeletal:        General: No edema.     Right lower leg: No edema.     Left lower leg: No edema.  Skin:    General: Skin is warm and dry.  Neurological:     Mental Status: He is alert.    MMSE - Mini Mental State Exam 05/12/2020  Orientation to time 5  Orientation to Place 4  Orientation to Place-comments is not from [place] so did not know what county we are in  Registration 3  Attention/ Calculation 5  Recall 3  Language- name 2 objects 2  Language- repeat 1  Language- follow 3 step command 3  Language- read & follow direction 1  Write a sentence 1  Copy design 1  Total score 29     Assessment/Plan: Please see individual problem list.  Problem List Items Addressed This Visit    Closed fracture of temporal bone [facility])    He will continue to see ENT.      Diabetes (HCC) - Primary    Patient's glucose has been reviewed on his freestyle libre.  His glucose tends to spike in the middle of the day up into the 200s.  The patient is very hesitant to continue with this type of glucose monitoring and is very stuck on the fact that in the past his blood sugar has been adequately controlled between 100-150.  I discussed with the patient that we are not focused on the past were looking at the present and into the future and that we need to get his blood sugar under better control given his blood sugar spikes.  The spikes do not seem to be related to him eating lunch as  he does not eat lunch and may be related to what he is eating for breakfast.  I had a lengthy discussion with him and his son regarding glimepiride and how this is not an optimal treatment for the patient's diabetes given risk of hypoglycemia.  We will attempt to get the patient on Jardiance 10 mg once daily.  If they are able to afford this he will start on it and then discontinue the glimepiride.  He will continue on Metformin and Actos at this time though we could consider transitioning the Actos to an alternative medication in the future.      Relevant Medications   empagliflozin (JARDIANCE) 10 MG TABS tablet   Other Relevant Orders   Basic Metabolic Panel (BMET)   Head injury    Patient will continue to follow with appropriate specialists.  He will continue to see ENT.      Memory difficulty    There does appear to be some measure of a memory issue going on though his MMSE is reassuring.  He does seem to have short-term memory difficulty based on history.  I discussed this with the patient and discussed with his son.  He is going to see the neurologist and I noted that they may be able to do an alternative memory test while he is in the office with them to help further evaluate this issue.  I will fax my note to the neurologist he is going to see.      Urinary retention    Patient is going to have a UroLift procedure.  He will continue to follow with urology.         This visit occurred during the SARS-CoV-2 public health emergency.  Safety protocols were in place, including screening questions prior to the visit, additional usage of staff PPE, and extensive cleaning of exam room while observing appropriate contact time as indicated for disinfecting solutions.   I have spent 42 minutes in the care of this patient regarding history taking, discussion of patient issues with son, completing physical exam, discussion of plan and counseling on diabetes and appropriate diabetic management, and  documentation.   Marikay Alar, MD [facility]

## 2020-05-12 NOTE — Patient Instructions (Signed)
Nice to see you. We will start you on jardiance for your diabetes.  If this is affordable please start on it and then discontinue your glimepiride.  If it is not affordable please let us know. We will contact you with your lab results. Please keep your neurology appointment as planned.

## 2020-05-12 NOTE — Assessment & Plan Note (Signed)
Patient will continue to follow with appropriate specialists.  He will continue to see ENT.

## 2020-05-12 NOTE — Telephone Encounter (Signed)
I spoke with the patient's son today.  He does report some concerns regarding the patient's memory.  He notes the memory issues did start before his relatively recent fall with a head injury though have worsened since then.  Has short-term memory difficulties.  Does have family history of dementia.  He wonders if there is a test that we can do for this.  Also wants some guidance on the patient's diabetes and how to get the patient to consistently check.  He is currently wearing a freestyle libre.  I have asked him to bring the reader device. We will discuss all this further at the patients visit today.

## 2020-05-12 NOTE — Assessment & Plan Note (Signed)
There does appear to be some measure of a memory issue going on though his MMSE is reassuring.  He does seem to have short-term memory difficulty based on history.  I discussed this with the patient and discussed with his son.  He is going to see the neurologist and I noted that they may be able to do an alternative memory test while he is in the office with them to help further evaluate this issue.  I will fax my note to the neurologist he is going to see.

## 2020-05-12 NOTE — Assessment & Plan Note (Signed)
He will continue to see ENT. 

## 2020-05-12 NOTE — Assessment & Plan Note (Signed)
Patient's glucose has been reviewed on his freestyle libre.  His glucose tends to spike in the middle of the day up into the 200s.  The patient is very hesitant to continue with this type of glucose monitoring and is very stuck on the fact that in the past his blood sugar has been adequately controlled between 100-150.  I discussed with the patient that we are not focused on the past were looking at the present and into the future and that we need to get his blood sugar under better control given his blood sugar spikes.  The spikes do not seem to be related to him eating lunch as he does not eat lunch and may be related to what he is eating for breakfast.  I had a lengthy discussion with him and his son regarding glimepiride and how this is not an optimal treatment for the patient's diabetes given risk of hypoglycemia.  We will attempt to get the patient on Jardiance 10 mg once daily.  If they are able to afford this he will start on it and then discontinue the glimepiride.  He will continue on Metformin and Actos at this time though we could consider transitioning the Actos to an alternative medication in the future.

## 2020-05-15 ENCOUNTER — Telehealth: Payer: Self-pay

## 2020-05-15 ENCOUNTER — Other Ambulatory Visit: Payer: Self-pay | Admitting: Family Medicine

## 2020-05-15 DIAGNOSIS — N401 Enlarged prostate with lower urinary tract symptoms: Secondary | ICD-10-CM

## 2020-05-15 NOTE — Telephone Encounter (Signed)
Pt's son [name] called and states thatempagliflozin (JARDIANCE) 10 MG TABS tablet is not affordable for pt and wants Dr Birdie Sons to know. His number is  [phone]

## 2020-05-16 ENCOUNTER — Encounter: Payer: Medicare Other | Admitting: Occupational Therapy

## 2020-05-16 NOTE — Telephone Encounter (Signed)
Noted. Can you contact the pharmacy and see if the can tell us if any of the medications in the same class as jardiance are covered by his insurance? Thanks.

## 2020-05-17 ENCOUNTER — Encounter: Payer: Self-pay | Admitting: Urology

## 2020-05-18 ENCOUNTER — Encounter: Payer: Medicare Other | Admitting: Occupational Therapy

## 2020-05-18 NOTE — Telephone Encounter (Signed)
I put the list of medications covered by the patients insurance in the lab basket.  Alexander Harmon,cma

## 2020-05-22 ENCOUNTER — Ambulatory Visit: Payer: Medicare Other

## 2020-05-22 ENCOUNTER — Other Ambulatory Visit
Admission: RE | Admit: 2020-05-22 | Discharge: 2020-05-22 | Disposition: A | Discharge status: Home / Self Care | Payer: Medicare Other | Source: Ambulatory Visit | Attending: Urology | Admitting: Urology

## 2020-05-22 ENCOUNTER — Other Ambulatory Visit: Payer: Self-pay

## 2020-05-22 DIAGNOSIS — N401 Enlarged prostate with lower urinary tract symptoms: Secondary | ICD-10-CM

## 2020-05-22 HISTORY — DX: Personal history of urinary calculi: Z87.442

## 2020-05-22 NOTE — Progress Notes (Signed)
Patient present today for UA collection from catheter for pre-op culture. Catheter was disconnected from leg bag and cleaned with alcohol wipe and plugged in a clean fashion. Patient sat for and then 25ml of yellow clear urine was collected into a sterile container for UA and CX. Catheter was reattached to leg bag. No complications or difficulties were noted, patient tolerated well.

## 2020-05-22 NOTE — Patient Instructions (Signed)
Your procedure is scheduled on: Tuesday May 30, 2020. Report to Day Surgery inside [facility] 2nd floor (stop by Registration desk first floor). To find out your arrival time please call [phone] between 1PM - 3PM on Monday May 29, 2020.  Remember: Instructions that are not followed completely may result in serious medical risk,  up to and including death, or upon the discretion of your surgeon and anesthesiologist your  surgery may need to be rescheduled.     _X__ 1. Do not eat food after midnight the night before your procedure.                 No chewing gum or hard candies. You may drink clear liquids up to 2 hours                 before you are scheduled to arrive for your surgery- DO not drink clear                 liquids within 2 hours of the start of your surgery.                 Clear Liquids include:  water, apple juice without pulp, clear Gatorade, G2 or                  Gatorade Zero (avoid Red/Purple/Blue), Black Coffee or Tea (Do not add                 anything to coffee or tea).  __X__2.  On the morning of surgery brush your teeth with toothpaste and water, you                may rinse your mouth with mouthwash if you wish.  Do not swallow any toothpaste of mouthwash.     _X__ 3.  No Alcohol for 24 hours before or after surgery.   _X__ 4.  Do Not Smoke or use e-cigarettes For 24 Hours Prior to Your Surgery.                 Do not use any chewable tobacco products for at least 6 hours prior to                 Surgery.  _X__  5.  Do not use any recreational drugs (marijuana, cocaine, heroin, ecstasy, MDMA or other)                For at least one week prior to your surgery.  Combination of these drugs with anesthesia                May have life threatening results.  __X__ 6.  Notify your doctor if there is any change in your medical condition      (cold, fever, infections).     Do not wear jewelry, make-up, hairpins, clips or nail  polish. Do not wear lotions, powders, or perfumes. You may wear deodorant. Do not shave 48 hours prior to surgery. Men may shave face and neck. Do not bring valuables to the hospital.    [facility] is not responsible for any belongings or valuables.  Contacts, dentures or bridgework may not be worn into surgery. Leave your suitcase in the car. After surgery it may be brought to your room. For patients admitted to the hospital, discharge time is determined by your treatment team.   Patients discharged the day of surgery will not be allowed to drive home.  Make arrangements for someone to be with you for the first 24 hours of your Same Day Discharge.    Please read over the following fact sheets that you were given:     _X___ Take these medicines the morning of surgery with A SIP OF WATER:    1. None   ____ Fleet Enema (as directed)   ____ Use CHG Soap (or wipes) as directed  ____ Use Benzoyl Peroxide Gel as instructed  ____ Use inhalers on the day of surgery  ____ Stop metformin 2 days prior to surgery    ____ Take 1/2 of usual insulin dose the night before surgery. No insulin the morning          of surgery.   __X__ Stop Anti-inflammatories such as Ibuprofen, Aleve, Advil, naproxen, aspirin and or BC powders.    __X__ Stop supplements until after surgery.    __X__ Do not start any herbal supplements before your procedure.    If you have any questions regarding your pre-procedure instructions,  Please call Pre-admit Testing at [phone].

## 2020-05-23 ENCOUNTER — Encounter: Payer: Self-pay | Admitting: Urology

## 2020-05-23 ENCOUNTER — Encounter: Payer: Medicare Other | Admitting: Occupational Therapy

## 2020-05-23 LAB — URINALYSIS, COMPLETE
Bilirubin, UA: NEGATIVE
Glucose, UA: NEGATIVE
Ketones, UA: NEGATIVE
Nitrite, UA: NEGATIVE
Protein,UA: NEGATIVE
Specific Gravity, UA: 1.01 (ref 1.005–1.030)
Urobilinogen, Ur: 0.2 mg/dL (ref 0.2–1.0)
pH, UA: 5.5 (ref 5.0–7.5)

## 2020-05-23 LAB — MICROSCOPIC EXAMINATION

## 2020-05-23 NOTE — Progress Notes (Signed)
[facility] Perioperative Services  Pre-Admission/Anesthesia Testing Clinical Review  Date: 05/23/20  Patient Demographics:  Name: Alexander Harmon DOB:   1933/06/10 MRN:   [id number]  Planned Surgical Procedure(s):    Case: [id number] Date/Time: 05/30/20 1327   Procedure: CYSTOSCOPY WITH INSERTION OF UROLIFT (N/A )   Anesthesia type: Monitor Anesthesia Care   Pre-op diagnosis: benign prostatic hypertrophy with urinary retention   Location: [facility] OR ROOM 10 / [facility] FOR ANESTHESIA GROUP   Surgeons: Abbie Sons, MD    NOTE: Available PAT nursing documentation and vital signs have been reviewed. Clinical nursing staff has updated patient's PMH/PSHx, current medication list, and drug allergies/intolerances to ensure comprehensive history available to assist in medical decision making as it pertains to the aforementioned surgical procedure and anticipated anesthetic course.   Clinical Discussion:  Alexander Harmon is a 86 y.o. male who is submitted for pre-surgical anesthesia review and clearance prior to him undergoing the above procedure. Patient has never been a smoker. Pertinent PMH includes: CAD (s/p CABG), HTN, HLD, T2DM, recent fall with head injury (cortical SAH, bilateral SDH, occipital skull fracture), memory issues, BPH  Patient lives out of state and is staying in [place] with his son. Patient sustained a mechanical fall on 03/17/2020. Patient describes MOI as being when he was walking down some wooden stairs when he stumbled/lost balance and fell. Patient did strike his head on the wooden stairs and concrete. He was subsequently airlifted from the site to [facility] in [place]. Imaging of his head revealed a cortical SAH with trace bilateral SDHs and left occipital skull fracture with extension into the areas of the mastoid temporal bone. Patient reportedly admitted to the hospital for over a week; unable to view records in [place]. Patient has  been seen in consult as an outpatient by neurosurgery Izora Ribas, MD); notes reviewed.Patient with hearing difficulties, fatigue, and son feels like memory decline has been progressive since fall. Symptom constellation felt to be consistent with post-concussive syndrome. Neurosurgery aware of upcoming urological procedure and clearance to proceed has been issued with an overall LOW risk stratification.   Patient was seen in preoperative consult by cardiology Rockey Situ, MD) on 05/09/2020; notes reviewed. Patient establishing care with this provider. At the time of his visit, patient reported that he was doing well overall from a cardiovascular perspective. He denies chest pain, SOB, PND, orthopnea, palpitations, peripheral edema, vertiginous symptoms, and presyncope/syncope. Patient having issues with urinary retention and is scheduled for urological procedure soon. Patient has a (+) cardiac history. Records are limited as patient is from [place]; records have been [facility] from [facility]. He has a history of CAD and is s/p CABG in 2002. HTN well controlled with lifestyle modifications; no medications. He is on oral therapy for his T2DM; no recent Hgb A1c available for review. Patient advised that he has labs scheduled soon with his PCP. He is on a statin for his HLD. ECG in the office revealed NSR at 62 bpm with no significant ST or T wave abnormalities. Functional capacity, as defined by DASI, is documented as being >/= 4 METS. No changes were made to his current medication regimen. Per cardiology, "this patient is optimized for surgery and may proceed with the planned procedural course with an ACCEPTABLE risk stratification". Patient is not any daily anticoagulation or antiplatelet therapy.   He denies previous perioperative complications with anesthesia. Of note, patient advising that he was told that he should not undergo a general  anesthetic course for this procedure given his recent TBI.  Spoke with Dr. Rhea Bleacher office and was advised that there should be no limitations on anesthetic course at this point. As it stands at this point, patient is planned for a MAC anesthetic course for his upcoming urological procedure.    Vitals with BMI 05/22/2020 05/12/2020 05/09/2020  Height _0  _1  -  Weight 152 lbs 154 lbs -  BMI 94.4 96.75 -  Systolic - 916 384  Diastolic - 80 66  Pulse - 70 -    Providers/Specialists:   NOTE: Primary physician provider listed below. Patient may have been seen by APP or partner within same practice.   PROVIDER ROLE / SPECIALTY LAST Claud Kelp, MD Urology (Surgeon)  05/03/2020  Leone Haven, MD Primary Care Provider  05/12/2020  Ida Rogue, MD Cardiology  05/09/2020  Meade Maw, MD Neurosurgery  05/02/2020   Allergies:  Other  Current Home Medications:   No current facility-administered medications for this encounter.   Marland Kitchen glimepiride (AMARYL) 2 MG tablet  . lovastatin (MEVACOR) 40 MG tablet  . meclizine (ANTIVERT) 25 MG tablet  . metFORMIN (GLUCOPHAGE) 500 MG tablet  . Multiple Vitamin (MULTIVITAMIN WITH MINERALS) TABS tablet  . pioglitazone (ACTOS) 30 MG tablet  . tamsulosin (FLOMAX) 0.4 MG CAPS capsule  . empagliflozin (JARDIANCE) 10 MG TABS tablet  . glucose blood test strip   History:   Past Medical History:  Diagnosis Date  . Complication of anesthesia 02/2020   no GEneral anesthesia due to head trauma   . Diabetes ([place]) 03/29/2020  . Essential hypertension 03/29/2020  . History of kidney stones    Past Surgical History:  Procedure Laterality Date  . CORONARY ARTERY BYPASS GRAFT     No family history on file. Social History   Tobacco Use  . Smoking status: Never Smoker  . Smokeless tobacco: Never Used  Vaping Use  . Vaping Use: Never used  Substance Use Topics  . Alcohol use: Yes    Comment: occasionally   . Drug use: Never    Pertinent Clinical Results:  LABS: Labs reviewed:  Acceptable for surgery.  No visits with results within 3 Day(s) from this visit.  Latest known visit with results is:  Office Visit on 05/12/2020  Component Date Value Ref Range Status  . Sodium 05/12/2020 138  135 - 145 mEq/L Final  . Potassium 05/12/2020 4.9  3.5 - 5.1 mEq/L Final  . Chloride 05/12/2020 103  96 - 112 mEq/L Final  . CO2 05/12/2020 23  19 - 32 mEq/L Final  . Glucose, Bld 05/12/2020 293* 70 - 99 mg/dL Final  . BUN 05/12/2020 20  6 - 23 mg/dL Final  . Creatinine, Ser 05/12/2020 1.16  0.40 - 1.50 mg/dL Final  . GFR 05/12/2020 58.04* >60.00 mL/min Final   Calculated using the CKD-EPI Creatinine Equation (2021)  . Calcium 05/12/2020 9.5  8.4 - 10.5 mg/dL Final      ECG: Date: 05/09/2020 Time ECG obtained: 1146 AM Rate: 62 bpm Rhythm: normal sinus Axis (leads I and aVF): Normal Intervals: PR 178 ms. QRS 80 ms. QTc 406 ms. ST segment and T wave changes: No evidence of acute ST segment elevation or depression Comparison: No previous tracings available for review and comparison.    IMAGING / PROCEDURES: CT TEMPORAL BONES WO CONTRAST performed on 04/19/2020 1. Left temporal bone fracture extending through the mastoid air cells, but without breach of the otic capsule.  2.  Most of the mastoid air cells are opacified by fluid.  3. Fracture lines extend into the calvarium on the left in the squamous temporal bone and occipital bone regions.  4. No evidence of intracranial complication such as subdural or epidural hematoma. 5. Mild thickening of the tympanic membrane on the left. Question the possibility of mild subluxation at the articulation of the malleus and incus. This is not definite.  Impression and Plan:  JAHLEN BOLLMAN has been referred for pre-anesthesia review and clearance prior to him undergoing the planned anesthetic and procedural courses. Available labs, pertinent testing, and imaging results were personally reviewed by me. This patient has been  appropriately cleared by cardiology Rockey Situ, MD) and neurosurgery Izora Ribas, MD).   Based on clinical review performed today (05/23/20), barring any significant acute changes in the patient's overall condition, it is anticipated that he will be able to proceed with the planned surgical intervention. Any acute changes in clinical condition may necessitate his procedure being postponed and/or cancelled. Pre-surgical instructions were reviewed with the patient during his PAT appointment and questions were fielded by PAT clinical staff.  Honor Loh, MSN, APRN, FNP-C, CEN [facility]  Peri-operative Services Nurse Practitioner Phone: [phone] 05/23/20 8:13 AM  NOTE: This note has been prepared using Dragon dictation software. Despite my best ability to proofread, there is always the potential that unintentional transcriptional errors may still occur from this process.

## 2020-05-24 ENCOUNTER — Other Ambulatory Visit: Payer: Self-pay | Admitting: Family Medicine

## 2020-05-25 ENCOUNTER — Encounter: Payer: Medicare Other | Admitting: Occupational Therapy

## 2020-05-26 ENCOUNTER — Other Ambulatory Visit
Admission: RE | Admit: 2020-05-26 | Discharge: 2020-05-26 | Disposition: A | Discharge status: Home / Self Care | Payer: Medicare Other | Source: Ambulatory Visit | Attending: Urology | Admitting: Urology

## 2020-05-26 ENCOUNTER — Other Ambulatory Visit: Payer: Self-pay

## 2020-05-26 DIAGNOSIS — Z20822 Contact with and (suspected) exposure to covid-19: Secondary | ICD-10-CM | POA: Insufficient documentation

## 2020-05-26 DIAGNOSIS — Z01812 Encounter for preprocedural laboratory examination: Secondary | ICD-10-CM | POA: Insufficient documentation

## 2020-05-26 LAB — SARS CORONAVIRUS 2 (TAT 6-24 HRS): SARS Coronavirus 2: NEGATIVE

## 2020-05-29 ENCOUNTER — Other Ambulatory Visit: Payer: Self-pay | Admitting: Radiology

## 2020-05-29 DIAGNOSIS — N390 Urinary tract infection, site not specified: Secondary | ICD-10-CM

## 2020-05-29 DIAGNOSIS — N401 Enlarged prostate with lower urinary tract symptoms: Secondary | ICD-10-CM

## 2020-05-29 LAB — CULTURE, URINE COMPREHENSIVE

## 2020-05-29 MED ORDER — LINAGLIPTIN 5 MG PO TABS: 5.0000 mg | ORAL_TABLET | Freq: Every day | ORAL | 2 refills | Status: AC

## 2020-05-29 MED ORDER — DAPAGLIFLOZIN PROPANEDIOL 5 MG PO TABS: 5.0000 mg | ORAL_TABLET | Freq: Every day | ORAL | 2 refills | Status: DC

## 2020-05-29 MED ORDER — FREESTYLE LIBRE 14 DAY READER DEVI: 0 refills | Status: DC

## 2020-05-29 MED ORDER — FREESTYLE LIBRE 14 DAY SENSOR MISC: 1 refills | Status: DC

## 2020-05-29 MED ORDER — SULFAMETHOXAZOLE-TRIMETHOPRIM 800-160 MG PO TABS: 1.0000 | ORAL_TABLET | Freq: Two times a day (BID) | ORAL | 0 refills | Status: AC

## 2020-05-29 NOTE — Telephone Encounter (Signed)
Noted.  I will send in Tradjenta to see if that is less expensive.  We may need to see if he would be willing to see the clinical pharmacist to help figure out what would be best covered by his insurance.  I have also sent the freestyle libre in.  Thanks.

## 2020-05-29 NOTE — Telephone Encounter (Signed)
Called to inform the Patient son. He states that the [place] cost more than the [place]. Informed him I would call the pharmacy.  Called [facility] and they state that with insurance the Marcelline Deist is $195 for 30 day supply.  Also requesting a freestyle libre cgm to go on the Patient's arm. States that the Patient had this in the past with another provider and would like to have this again  Please advise, would you like to refer to [facility]?

## 2020-05-29 NOTE — Telephone Encounter (Signed)
Patient's son informed and verbalized understanding.  Will try to pick these items up from the pharmacy. If they're still too expensive they are agreeable to referral to [facility].

## 2020-05-29 NOTE — Addendum Note (Signed)
Addended by: Glori Luis on: 05/29/2020 02:53 PM   Modules accepted: Orders

## 2020-05-29 NOTE — Telephone Encounter (Signed)
Noted.  Form reviewed. Insurance appears to cover farxiga which is in the same class as jardiance. I will send the farxiga in for him to start on.

## 2020-05-29 NOTE — Addendum Note (Signed)
Addended by: Glori Luis on: 05/29/2020 10:46 AM   Modules accepted: Orders

## 2020-05-30 ENCOUNTER — Encounter: Payer: Self-pay | Admitting: Urology

## 2020-05-30 ENCOUNTER — Ambulatory Visit
Admission: RE | Admit: 2020-05-30 | Discharge: 2020-05-30 | Disposition: A | Discharge status: Home / Self Care | Payer: Medicare Other | Attending: Urology | Admitting: Urology

## 2020-05-30 ENCOUNTER — Encounter
Admission: RE | Disposition: A | Discharge status: Home / Self Care | Payer: Self-pay | Source: Home / Self Care | Attending: Urology

## 2020-05-30 ENCOUNTER — Ambulatory Visit: Payer: Medicare Other | Admitting: Urgent Care

## 2020-05-30 ENCOUNTER — Other Ambulatory Visit: Payer: Self-pay

## 2020-05-30 DIAGNOSIS — R338 Other retention of urine: Secondary | ICD-10-CM

## 2020-05-30 DIAGNOSIS — R339 Retention of urine, unspecified: Secondary | ICD-10-CM

## 2020-05-30 DIAGNOSIS — Z87820 Personal history of traumatic brain injury: Secondary | ICD-10-CM | POA: Insufficient documentation

## 2020-05-30 DIAGNOSIS — N401 Enlarged prostate with lower urinary tract symptoms: Secondary | ICD-10-CM | POA: Insufficient documentation

## 2020-05-30 DIAGNOSIS — E119 Type 2 diabetes mellitus without complications: Secondary | ICD-10-CM | POA: Insufficient documentation

## 2020-05-30 DIAGNOSIS — Z951 Presence of aortocoronary bypass graft: Secondary | ICD-10-CM | POA: Insufficient documentation

## 2020-05-30 HISTORY — PX: CYSTOSCOPY WITH INSERTION OF UROLIFT: SHX6678

## 2020-05-30 HISTORY — DX: Atherosclerotic heart disease of native coronary artery without angina pectoris: I25.10

## 2020-05-30 HISTORY — DX: Hyperlipidemia, unspecified: E78.5

## 2020-05-30 LAB — GLUCOSE, CAPILLARY
Glucose-Capillary: 166 mg/dL — ABNORMAL HIGH (ref 70–99)
Glucose-Capillary: 108 mg/dL — ABNORMAL HIGH (ref 70–99)

## 2020-05-30 SURGERY — CYSTOSCOPY WITH INSERTION OF UROLIFT
Anesthesia: Monitor Anesthesia Care | Site: Prostate

## 2020-05-30 MED ORDER — PHENYLEPHRINE HCL (PRESSORS) 10 MG/ML IV SOLN
INTRAVENOUS | Status: DC | PRN
  Administered 2020-05-30: 100 ug via INTRAVENOUS

## 2020-05-30 MED ORDER — LIDOCAINE HCL 2 % EX GEL
CUTANEOUS | Status: DC | PRN
  Administered 2020-05-30: 1 via URETHRAL

## 2020-05-30 MED ORDER — PROPOFOL 500 MG/50ML IV EMUL
INTRAVENOUS | Status: DC | PRN
  Administered 2020-05-30: 125 ug/kg/min via INTRAVENOUS

## 2020-05-30 MED ORDER — FAMOTIDINE 20 MG PO TABS
ORAL_TABLET | ORAL | Status: AC
  Administered 2020-05-30: 20 mg via ORAL
  Filled 2020-05-30: qty 1

## 2020-05-30 MED ORDER — ORAL CARE MOUTH RINSE: 15.0000 mL | Freq: Once | OROMUCOSAL | Status: AC

## 2020-05-30 MED ORDER — CHLORHEXIDINE GLUCONATE 0.12 % MT SOLN: 15.0000 mL | Freq: Once | OROMUCOSAL | Status: AC

## 2020-05-30 MED ORDER — SODIUM CHLORIDE 0.9 % IV SOLN: INTRAVENOUS | Status: DC

## 2020-05-30 MED ORDER — SODIUM CHLORIDE 0.9 % IV SOLN
1.0000 g | Freq: Once | INTRAVENOUS | Status: AC
  Administered 2020-05-30: 1 g via INTRAVENOUS
  Filled 2020-05-30: qty 1

## 2020-05-30 MED ORDER — FAMOTIDINE 20 MG PO TABS: 20.0000 mg | ORAL_TABLET | Freq: Once | ORAL | Status: AC

## 2020-05-30 MED ORDER — PROPOFOL 10 MG/ML IV BOLUS
INTRAVENOUS | Status: DC | PRN
  Administered 2020-05-30: 40 mg via INTRAVENOUS

## 2020-05-30 MED ORDER — CHLORHEXIDINE GLUCONATE 0.12 % MT SOLN
OROMUCOSAL | Status: AC
  Administered 2020-05-30: 15 mL via OROMUCOSAL
  Filled 2020-05-30: qty 15

## 2020-05-30 SURGICAL SUPPLY — 19 items
BAG DRAIN CYSTO-URO LG1000N (MISCELLANEOUS) IMPLANT
BRUSH SCRUB EZ  4% CHG (MISCELLANEOUS) ×1
BRUSH SCRUB EZ 4% CHG (MISCELLANEOUS) ×1 IMPLANT
GLOVE BIOGEL PI IND STRL 7.5 (GLOVE) ×1 IMPLANT
GLOVE BIOGEL PI INDICATOR 7.5 (GLOVE) ×1
GOWN STRL REUS W/ TWL LRG LVL3 (GOWN DISPOSABLE) ×1 IMPLANT
GOWN STRL REUS W/ TWL XL LVL3 (GOWN DISPOSABLE) ×1 IMPLANT
GOWN STRL REUS W/TWL LRG LVL3 (GOWN DISPOSABLE) ×2
GOWN STRL REUS W/TWL XL LVL3 (GOWN DISPOSABLE) ×2
HOLDER FOLEY CATH W/STRAP (MISCELLANEOUS) ×2 IMPLANT
KIT TURNOVER CYSTO (KITS) ×2 IMPLANT
PACK CYSTO AR (MISCELLANEOUS) ×2 IMPLANT
SET CYSTO W/LG BORE CLAMP LF (SET/KITS/TRAYS/PACK) ×2 IMPLANT
SURGILUBE 2OZ TUBE FLIPTOP (MISCELLANEOUS) IMPLANT
SYR TOOMEY 50ML (SYRINGE) ×2 IMPLANT
SYSTEM UROLIFT (Male Continence) ×14 IMPLANT
TRAY FOLEY SLVR 16FR LF STAT (SET/KITS/TRAYS/PACK) ×2 IMPLANT
WATER STERILE IRR 1000ML POUR (IV SOLUTION) ×2 IMPLANT
WATER STERILE IRR 3000ML UROMA (IV SOLUTION) ×4 IMPLANT

## 2020-05-30 NOTE — Anesthesia Preprocedure Evaluation (Signed)
Anesthesia Evaluation  Patient identified by MRN, date of birth, ID band Patient awake and Patient confused    Reviewed: Allergy & Precautions, H&P , NPO status , Patient's Chart, lab work & pertinent test results  History of Anesthesia Complications Negative for: history of anesthetic complications  Airway Mallampati: III  TM Distance: <3 FB Neck ROM: limited    Dental  (+) Chipped   Pulmonary neg pulmonary ROS, neg shortness of breath,    Pulmonary exam normal        Cardiovascular Exercise Tolerance: Good hypertension, (-) angina+ CAD and + CABG  (-) DOE Normal cardiovascular exam     Neuro/Psych negative neurological ROS  negative psych ROS   GI/Hepatic negative GI ROS, Neg liver ROS, neg GERD  ,  Endo/Other  diabetes, Type 2  Renal/GU negative Renal ROS  negative genitourinary   Musculoskeletal   Abdominal   Peds  Hematology negative hematology ROS (+)   Anesthesia Other Findings Past Medical History: No date: CAD (coronary artery disease) 69/4854: Complication of anesthesia     Comment:  no GEneral anesthesia due to head trauma  03/29/2020: Diabetes ([place]) 03/29/2020: Essential hypertension No date: History of kidney stones No date: HLD (hyperlipidemia) 2002: Hx of CABG     Comment:  done in [place]  Past Surgical History: No date: CORONARY ARTERY BYPASS GRAFT     Reproductive/Obstetrics negative OB ROS                             Anesthesia Physical Anesthesia Plan  ASA: III  Anesthesia Plan: MAC   Post-op Pain Management:    Induction: Intravenous  PONV Risk Score and Plan: Propofol infusion and TIVA  Airway Management Planned: Natural Airway and Nasal Cannula  Additional Equipment:   Intra-op Plan:   Post-operative Plan:   Informed Consent: I have reviewed the patients History and Physical, chart, labs and discussed the procedure including the risks,  benefits and alternatives for the proposed anesthesia with the patient or authorized representative who has indicated his/her understanding and acceptance.     Dental Advisory Given  Plan Discussed with: Anesthesiologist, CRNA and Surgeon  Anesthesia Plan Comments: (Thorough discussion with the patient and his son about his anesthetic plan.  Son reports that the patient had been told to avoid general anesthesia after his head trauma last year.  Son reports that the patient has not been himself since his head trauma and that the family would like to avoid any further mental deterioration.  Spinal was discussed with the patient and has son but after discussion they elected for a propofol anesthetic.  I discussed with them our plan and how we will try to minimize long acting agents for his mental protection.  I did explain to them that the patient is still at risk for mental deterioration from the anesthetic and they voiced understanding.  Patient and son consented for risks of anesthesia including but not limited to:  - adverse reactions to medications - risk of airway placement if required - damage to eyes, teeth, lips or other oral mucosa - nerve damage due to positioning  - sore throat or hoarseness - Damage to heart, brain, nerves, lungs, other parts of body or loss of life  They voiced understanding.)        Anesthesia Quick Evaluation

## 2020-05-30 NOTE — Anesthesia Procedure Notes (Signed)
Date/Time: 05/30/2020 4:10 PM Performed by: Junious Silk, CRNA Pre-anesthesia Checklist: Patient identified, Emergency Drugs available, Suction available, Patient being monitored and Timeout performed Oxygen Delivery Method: Simple face mask

## 2020-05-30 NOTE — OR Nursing (Signed)
1600 FOLEY REMOVED PER DR STOIOFF URINE OUT PUT .

## 2020-05-30 NOTE — Anesthesia Postprocedure Evaluation (Signed)
Anesthesia Post Note  Patient: Alexander Harmon  Procedure(s) Performed: CYSTOSCOPY WITH INSERTION OF UROLIFT (N/A Prostate)  Patient location during evaluation: PACU Anesthesia Type: General Level of consciousness: awake and alert Pain management: pain level controlled Vital Signs Assessment: post-procedure vital signs reviewed and stable Respiratory status: spontaneous breathing, nonlabored ventilation and respiratory function stable Cardiovascular status: blood pressure returned to baseline and stable Postop Assessment: no apparent nausea or vomiting Anesthetic complications: no   No complications documented.   Last Vitals:  Vitals:   05/30/20 1703 05/30/20 1712  BP: (!) 153/72 (!) 161/68  Pulse: (!) 55 67  Resp: 16 18  Temp: (!) 36.2 C (!) 36.2 C  SpO2: 100% 100%    Last Pain:  Vitals:   05/30/20 1712  TempSrc: Temporal  PainSc: 0-No pain                 Aurelio Brash Jaycen Vercher

## 2020-05-30 NOTE — Transfer of Care (Signed)
Immediate Anesthesia Transfer of Care Note  Patient: Alexander Harmon  Procedure(s) Performed: CYSTOSCOPY WITH INSERTION OF UROLIFT (N/A Prostate)  Patient Location: PACU  Anesthesia Type:General  Level of Consciousness: awake, alert  and oriented  Airway & Oxygen Therapy: Patient Spontanous Breathing and Patient connected to face mask oxygen  Post-op Assessment: Report given to RN and Post -op Vital signs reviewed and stable  Post vital signs: Reviewed and stable  Last Vitals:  Vitals Value Taken Time  BP    Temp    Pulse    Resp 11 05/30/20 1633  SpO2    Vitals shown include unvalidated device data.  Last Pain:  Vitals:   05/30/20 1156  TempSrc: Tympanic  PainSc: 0-No pain         Complications: No complications documented.

## 2020-05-30 NOTE — Interval H&P Note (Signed)
History and Physical Interval Note:  CV: RRR Lungs: Clear  05/30/2020 3:29 PM  Alexander Harmon  has presented today for surgery, with the diagnosis of benign prostatic hypertrophy with urinary retention.  The various methods of treatment have been discussed with the patient and family. After consideration of risks, benefits and other options for treatment, the patient has consented to  Procedure(s): CYSTOSCOPY WITH INSERTION OF UROLIFT (N/A) as a surgical intervention.  The patient's history has been reviewed, patient examined, no change in status, stable for surgery.  I have reviewed the patient's chart and labs.  Questions were answered to the patient's satisfaction.     Tawona Filsinger C Serrena Linderman

## 2020-05-30 NOTE — Discharge Instructions (Signed)
AMBULATORY SURGERY  DISCHARGE INSTRUCTIONS   1) The drugs that you were given will stay in your system until tomorrow so for the next 24 hours you should not:  A) Drive an automobile B) Make any legal decisions C) Drink any alcoholic beverage   2) You may resume regular meals tomorrow.  Today it is better to start with liquids and gradually work up to solid foods.  You may eat anything you prefer, but it is better to start with liquids, then soup and crackers, and gradually work up to solid foods.   3) Please notify your doctor immediately if you have any unusual bleeding, trouble breathing, redness and pain at the surgery site, drainage, fever, or pain not relieved by medication.  4) Your post-operative visit with Dr.                                     is: Date:                        Time:    Please call to schedule your post-operative visit.  5) Additional Instructions:  Urolift Post-Operative Instructions     Patient Expectations   1. Mild blood in your urine for about 1 week.  2. Urinary buring, frequency, and urgency for 10-14 days.  3. Mild pelvic pain 1-2 weeks.     Return to Activity     1. Drink water post procedure.  2. Take meds as needed.  Tylenol and/or Motrin is most helpful.  You may also by Pyridium/Azo over-the-counter for urinary burning.  Continue taking any prostate medications until your first postoperative visit.    3. No lifting or straining 48hrs.  4. Other activity when you feel up to it.  5.  You will be contacted by our office for an appointment for catheter removal this week  6.  Call our office [phone] for any questions or concerns.

## 2020-05-31 ENCOUNTER — Encounter: Payer: Self-pay | Admitting: Urology

## 2020-05-31 ENCOUNTER — Ambulatory Visit (INDEPENDENT_AMBULATORY_CARE_PROVIDER_SITE_OTHER): Payer: Medicare Other | Admitting: Urology

## 2020-05-31 DIAGNOSIS — R338 Other retention of urine: Secondary | ICD-10-CM

## 2020-05-31 DIAGNOSIS — N401 Enlarged prostate with lower urinary tract symptoms: Secondary | ICD-10-CM | POA: Diagnosis not present

## 2020-05-31 LAB — BLADDER SCAN AMB NON-IMAGING: SCA Result: 33

## 2020-05-31 NOTE — Op Note (Signed)
Preoperative diagnosis: BPH with urinary retention  Postoperative diagnosis: BPH with urinary retention  Procedure: Urolift (placement of 7 implants)  Surgeon: Bernardo Heater  Anesthesia: MAC  Complications: None  Drains: 21 French Foley catheter  Estimated blood loss: < 5 mL  Indications: 87 y.o. male with a history of traumatic brain injury after a fall in October 2021.  He developed urinary retention during that hospitalization and has failed multiple voiding trials on alpha-blocker therapy.  Urodynamic study was obtained which was consistent with outlet obstruction.  Management options including TURP with resection/ablation of the prostate as well as Urolift were discussed.  The patient has chosen to have a Urolift procedure.  He has been instructed to the procedure as well as risks and complications which include but are not limited to infection, bleeding, and inadequate treatment with the Urolift procedure alone, anesthetic complications, among others.  He understands these and desires to proceed.  Findings: Using the 17 French cystoscope, urethra and bladder were inspected.  There were no urethral lesions.  Prostatic urethra was remarkable for moderate lateral lobe enlargement and a small, nonobstructive median lobe.  The bladder was inspected circumferentially.  This revealed normal findings.  Description of procedure: The patient was properly identified in the holding area.  He received preoperative IV antibiotics.  He was taken to the operating room where IV sedation was obtained by anesthesia.  He was placed in the dorsolithotomy position.  Genitalia and perineum were prepped and draped.  Proper timeout was performed.  2% lidocaine gel was instilled per urethra.  A 19F cystoscope was inserted into the bladder. The cystoscopy bridge was replaced with a UroLift delivery device.  The 1st pair of implants were placed 1.5-2 cm from the bladder neck.   This was noted to flatten the small, median  lobe.  The 2nd pair of implants were placed just proximal to the verumontanum. The delivery device was then replaced with cystoscope and bridge and the implant location and opening effect was confirmed cystoscopically.  An additional pair of implants was placed between the prior pairs.  Repeat cystoscopy was again performed and there was persistent anterior visual occlusion of the right lateral lobe in the mid prostate.  A 7th implant was placed stacked below the mid implant with significant improvement in the outlet noted.  A final cystoscopy was conducted first to inspect the location and state of each implant and second, to confirm the presence of a continuous anterior channel was present through the prostatic urethra with irrigation flow turned off.  No injury to the bladder or urethra was detected. Seven implants were delivered in total.   With his history of urinary retention it was elected to place a 16 [place] Foley.  The urine was pink-tinged. He  tolerated procedure well andwas transported to the PACU in stable condition.  Plan: Office follow-up 24-48 hours for catheter removal/voiding trial   John Giovanni, MD

## 2020-06-01 ENCOUNTER — Telehealth: Payer: Self-pay

## 2020-06-01 ENCOUNTER — Other Ambulatory Visit: Payer: Self-pay | Admitting: Family Medicine

## 2020-06-01 ENCOUNTER — Encounter: Payer: Self-pay | Admitting: Urology

## 2020-06-01 NOTE — Telephone Encounter (Signed)
I called the pharmacy and she resent the Rx for the [facility].  Kyrstal Monterrosa,cma

## 2020-06-01 NOTE — Progress Notes (Signed)
05/31/2020 1:59 PM   Alexander Harmon 1934/05/16 [id number]  Referring provider: Glori Luis, MD [address] [place],  [place] [address]  Chief Complaint  Patient presents with  . Hematuria    HPI: 87 y.o. 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   Failed multiple voiding trials  Urodynamic study consistent with obstruction  Elected UroLift which was performed yesterday  Catheter was placed post procedure and he was scheduled for voiding trial tomorrow however states earlier this morning his catheter fell out.  He has been voiding but has urinary frequency and voiding small amounts  Small amounts of blood in the urine   PMH: Past Medical History:  Diagnosis Date  . CAD (coronary artery disease)   . Complication of anesthesia 02/2020   no GEneral anesthesia due to head trauma   . Diabetes (HCC) 03/29/2020  . Essential hypertension 03/29/2020  . History of kidney stones   . HLD (hyperlipidemia)   . Hx of CABG 2002   done in [place]    Surgical History: Past Surgical History:  Procedure Laterality Date  . CORONARY ARTERY BYPASS GRAFT    . CYSTOSCOPY WITH INSERTION OF UROLIFT N/A 05/30/2020   Procedure: CYSTOSCOPY WITH INSERTION OF UROLIFT;  Surgeon: [name], MD;  Location: [facility];  Service: Urology;  Laterality: N/A;    Home Medications:  Allergies as of 05/31/2020      Reactions   Other    general anesthesia - pt told not to be giving general anesthesia because had a traumatic brain injury 03/17/20      Medication List       Accurate as of May 31, 2020 11:59 PM. If you have any questions, ask your nurse or doctor.        FreeStyle Libre 14 Day Reader Hardie Pulley Check glucose 4 times daily   FreeStyle Libre 14 Day Sensor Misc Place on skin.  Check glucose 4 times daily.   glimepiride 2 MG tablet Commonly known as: Amaryl Take 1 tablet  (2 mg total) by mouth daily with breakfast.   glucose blood test strip Use once daily to check blood glucose, dispense Accu-Chek glucose strips, diagnosis code E11.9   linagliptin 5 MG Tabs tablet Commonly known as: Tradjenta Take 1 tablet (5 mg total) by mouth daily.   lovastatin 40 MG tablet Commonly known as: MEVACOR Take 1 tablet (40 mg total) by mouth at bedtime.   meclizine 25 MG tablet Commonly known as: ANTIVERT Take 1 tablet (25 mg total) by mouth 3 (three) times daily as needed for dizziness.   metFORMIN 500 MG tablet Commonly known as: GLUCOPHAGE Take 500 mg by mouth 2 (two) times daily with a meal.   multivitamin with minerals Tabs tablet Take 1 tablet by mouth daily.   pioglitazone 30 MG tablet Commonly known as: ACTOS Take 30 mg by mouth daily.   sulfamethoxazole-trimethoprim 800-160 MG tablet Commonly known as: BACTRIM DS Take 1 tablet by mouth every 12 (twelve) hours.   tamsulosin 0.4 MG Caps capsule Commonly known as: FLOMAX TAKE 1 CAPSULE BY MOUTH EVERY NIGHT AT BEDTIME       Allergies:  Allergies  Allergen Reactions  . Other     general anesthesia - pt told not to be giving general anesthesia because had a traumatic brain injury 03/17/20    Family History: History reviewed. No pertinent family history.  Social History:  reports that he has never smoked. He has never used smokeless tobacco. He reports current alcohol use. He reports that he does not use drugs.   Physical Exam: There were no vitals taken for this visit.  Constitutional:  Alert and oriented, No acute distress. HEENT: [place] AT, moist mucus membranes.  Trachea midline, no masses. Cardiovascular: No clubbing, cyanosis, or edema. Respiratory: Normal respiratory effort, no increased work of breathing.   Assessment & Plan:    1. Benign prostatic hyperplasia with urinary retention  Bladder scan PVR was 33 mL  Reassured patient that urinary urgency, frequency with occasional  episodes of urge incontinence are normal the first 1-2 weeks after the procedure  Follow-up office visit 1 month for bladder scan/IPSS  Instructed to call earlier for any problems   [name], MD  [facility] [address] [place], [place] [address] [phone]

## 2020-06-01 NOTE — Telephone Encounter (Signed)
Pt's son called and states that pharmacy [facility] on [address] is stating that they did not receive rx for freestyle libre sensor or reader. I see that it was ordered. Please resend to make sure they get it.

## 2020-06-02 ENCOUNTER — Other Ambulatory Visit: Payer: Self-pay | Admitting: Family Medicine

## 2020-06-07 MED ORDER — FREESTYLE LIBRE 14 DAY SENSOR MISC: 1 refills | Status: AC

## 2020-06-07 MED ORDER — FREESTYLE LIBRE 14 DAY READER DEVI: 0 refills | Status: AC

## 2020-06-07 NOTE — Telephone Encounter (Signed)
Resent to pharmacy with diagnosis code.

## 2020-06-14 ENCOUNTER — Other Ambulatory Visit: Payer: Self-pay | Admitting: Neurology

## 2020-06-14 DIAGNOSIS — Z87820 Personal history of traumatic brain injury: Secondary | ICD-10-CM

## 2020-06-16 NOTE — Telephone Encounter (Signed)
Can you complete the PA for this?

## 2020-06-17 ENCOUNTER — Other Ambulatory Visit: Payer: Self-pay | Admitting: Family Medicine

## 2020-06-20 ENCOUNTER — Other Ambulatory Visit: Payer: Self-pay | Admitting: Family Medicine

## 2020-06-21 ENCOUNTER — Ambulatory Visit: Payer: Medicare Other | Admitting: Diagnostic Neuroimaging

## 2020-06-21 NOTE — Telephone Encounter (Signed)
Please advise.  Dr. Birdie Sons is now his PCP.

## 2020-07-03 ENCOUNTER — Other Ambulatory Visit: Payer: Self-pay | Admitting: Family Medicine

## 2020-07-05 ENCOUNTER — Ambulatory Visit: Payer: Medicare Other | Admitting: Urology

## 2020-07-13 ENCOUNTER — Ambulatory Visit (INDEPENDENT_AMBULATORY_CARE_PROVIDER_SITE_OTHER): Payer: Medicare Other | Admitting: Urology

## 2020-07-13 ENCOUNTER — Other Ambulatory Visit: Payer: Self-pay

## 2020-07-13 ENCOUNTER — Encounter: Payer: Self-pay | Admitting: Urology

## 2020-07-13 VITALS — BP 166/74 | HR 59 | Ht 67.0 in | Wt 152.0 lb

## 2020-07-13 DIAGNOSIS — N401 Enlarged prostate with lower urinary tract symptoms: Secondary | ICD-10-CM | POA: Diagnosis not present

## 2020-07-13 DIAGNOSIS — R338 Other retention of urine: Secondary | ICD-10-CM

## 2020-07-13 LAB — BLADDER SCAN AMB NON-IMAGING: Scan Result: 122

## 2020-07-13 NOTE — Progress Notes (Signed)
07/13/2020 3:57 PM   Alexander Harmon 05-15-1934 [id number]  Referring provider: Glori Luis, MD [address] [place],  [place] [address]  Chief Complaint  Patient presents with  . Benign Prostatic Hypertrophy    HPI: 87 y.o. male presents for postop follow-up.   Developed urinary retention after suffering a fall with traumatic brain injury  Urodynamic study was consistent with obstruction  Elected UroLift which was performed 05/30/2020  Cath removed after 3 days and PVR was 33 mL  Was having significant frequency, urgency at that visit  He is still having frequency urgency but states it has significantly improved  IPSS today 12/35  No dysuria, pain or gross hematuria  Off tamsulosin   PMH: Past Medical History:  Diagnosis Date  . CAD (coronary artery disease)   . Complication of anesthesia 02/2020   no GEneral anesthesia due to head trauma   . Diabetes (HCC) 03/29/2020  . Essential hypertension 03/29/2020  . History of kidney stones   . HLD (hyperlipidemia)   . Hx of CABG 2002   done in [place]    Surgical History: Past Surgical History:  Procedure Laterality Date  . CORONARY ARTERY BYPASS GRAFT    . CYSTOSCOPY WITH INSERTION OF UROLIFT N/A 05/30/2020   Procedure: CYSTOSCOPY WITH INSERTION OF UROLIFT;  Surgeon: [name], MD;  Location: [facility];  Service: Urology;  Laterality: N/A;    Home Medications:  Allergies as of 07/13/2020      Reactions   Other    general anesthesia - pt told not to be giving general anesthesia because had a traumatic brain injury 03/17/20      Medication List       Accurate as of July 13, 2020  3:57 PM. If you have any questions, ask your nurse or doctor.        FreeStyle Libre 14 Day Reader Hardie Pulley CHECK GLUCOSE 4 TIMES DAILY. Dx code E11.9.   FreeStyle Libre 14 Day Sensor Misc PLACE ON SKIN. CHECK GLUCOSE 4 TIMES DAILY. Dx code E11.9.   glimepiride 2 MG tablet Commonly known as:  AMARYL TAKE 1 TABLET BY MOUTH DAILY WITH BREAKFAST   glucose blood test strip Use once daily to check blood glucose, dispense Accu-Chek glucose strips, diagnosis code E11.9   linagliptin 5 MG Tabs tablet Commonly known as: Tradjenta Take 1 tablet (5 mg total) by mouth daily.   lovastatin 40 MG tablet Commonly known as: MEVACOR Take 1 tablet (40 mg total) by mouth at bedtime.   meclizine 25 MG tablet Commonly known as: ANTIVERT Take 1 tablet (25 mg total) by mouth 3 (three) times daily as needed for dizziness.   metFORMIN 500 MG tablet Commonly known as: GLUCOPHAGE Take 500 mg by mouth 2 (two) times daily with a meal.   multivitamin with minerals Tabs tablet Take 1 tablet by mouth daily.   pioglitazone 30 MG tablet Commonly known as: ACTOS Take 30 mg by mouth daily.   sulfamethoxazole-trimethoprim 800-160 MG tablet Commonly known as: BACTRIM DS Take 1 tablet by mouth every 12 (twelve) hours.   tamsulosin 0.4 MG Caps capsule Commonly known as: FLOMAX TAKE 1 CAPSULE BY MOUTH EVERY NIGHT AT BEDTIME.       Allergies:  Allergies  Allergen Reactions  . Other     general anesthesia - pt told not to be giving general anesthesia because had a traumatic brain injury 03/17/20    Family History: No family history on file.  Social History:  reports that he has never smoked. He has never used smokeless tobacco. He reports current alcohol use. He reports that he does not use drugs.   Physical Exam: BP (!) 166/74   Pulse (!) 59   Ht 5\' 7"  (1.702 m)   Wt 152 lb (68.9 kg)   BMI 23.81 kg/m   Constitutional:  Alert and oriented, No acute distress. HEENT: [place] AT, moist mucus membranes.  Trachea midline, no masses. Cardiovascular: No clubbing, cyanosis, or edema. Respiratory: Normal respiratory effort, no increased work of breathing.   Assessment & Plan:    1. Benign prostatic hyperplasia with urinary retention  Bladder scan PVR today 122 mL  Frequency, urgency  improving and we discussed some of this could be due to his traumatic brain injury  Currently satisfied with his voiding pattern  He is in the process of moving back to [place] where he was living at the time of his fall  Would recommend urologic follow-up with PVR in 6 months    [place], MD  [facility] [address], [address] [place], [place] [phone]

## 2020-07-16 ENCOUNTER — Encounter: Payer: Self-pay | Admitting: Urology

## 2020-07-19 NOTE — Telephone Encounter (Signed)
PA denied.  Alexander Harmon,cma

## 2020-08-07 ENCOUNTER — Ambulatory Visit: Payer: Medicare Other

## 2020-08-11 ENCOUNTER — Ambulatory Visit: Payer: Medicare Other | Admitting: Family Medicine

## 2020-11-24 DEATH — deceased

## 2021-08-21 IMAGING — CT CT TEMPORAL BONES W/O CM
3 of 7 series · 14 of 40 positions shown, 17 images · non-contrast
Comparison: None.

CLINICAL DATA: Hearing loss, left worse than right. Fell in
[REDACTED].

EXAM:
CT TEMPORAL BONES WITHOUT CONTRAST
TECHNIQUE: Axial and coronal plane CT imaging of the petrous temporal bones was
performed with thin-collimation image reconstruction. No intravenous
contrast was administered. Multiplanar CT image reconstructions were
also generated.

[Series 3: ax soft temperal bones 2.00 · axial · 0.33mm/px · z∈[-498,-470]mm · 3 of 35 slices shown]
[im 7/35  brain]
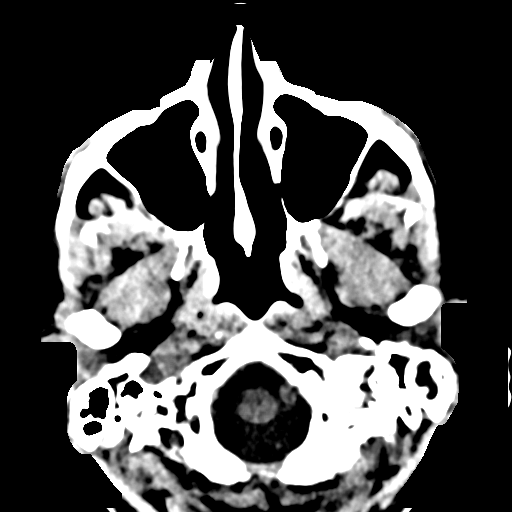
[im 14/35  brain]
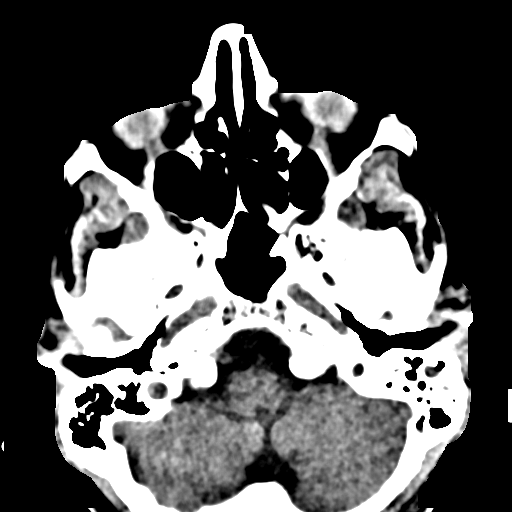
[im 21/35  brain]
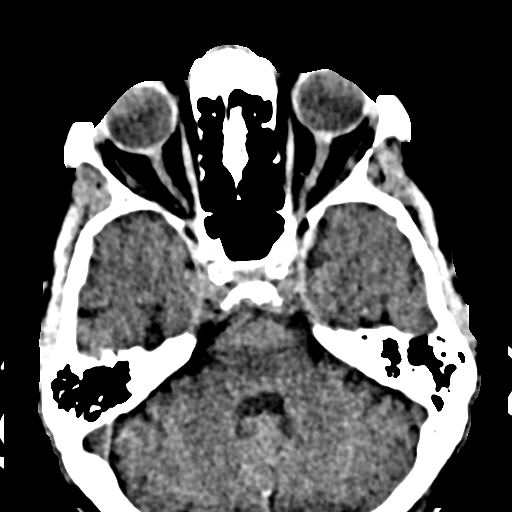

[Series 6: cor bone temperal bones 0.80 cor · coronal · 0.14mm/px · 1 of 212 slices shown]
[im 106/212  bone]
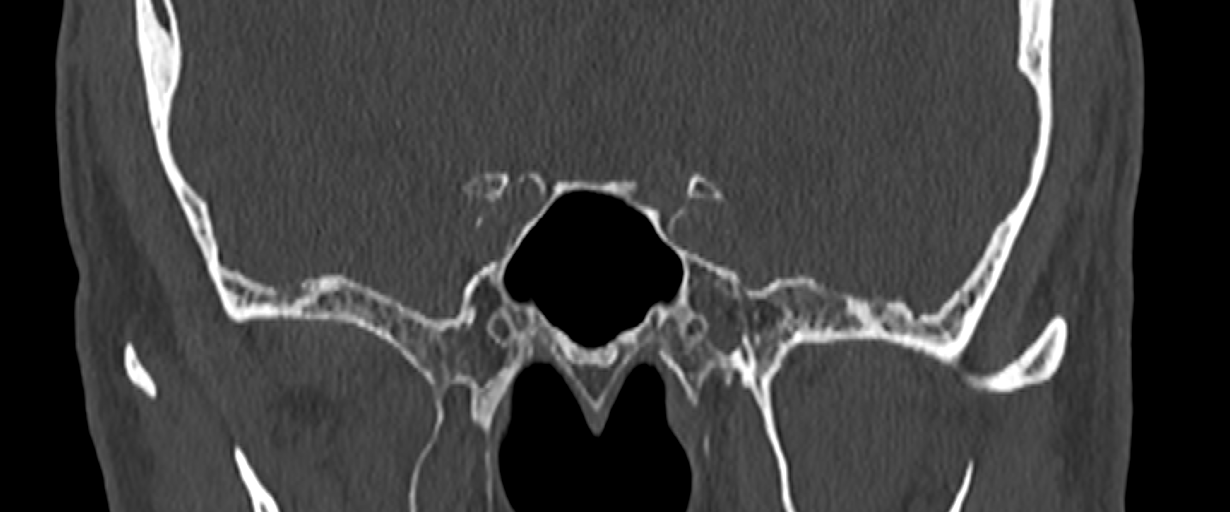

[Series 8: ax mag right temperal bones 0.60 · axial · 0.20mm/px · z∈[-500,-459]mm · 10 of 83 slices shown, 13 images]
[im 8/83  brain]
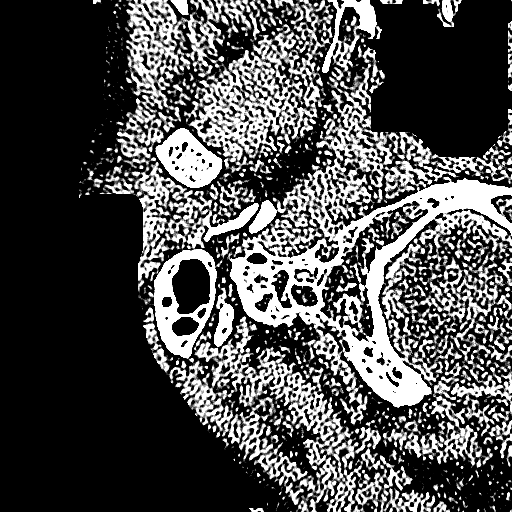
[im 8/83  bone]
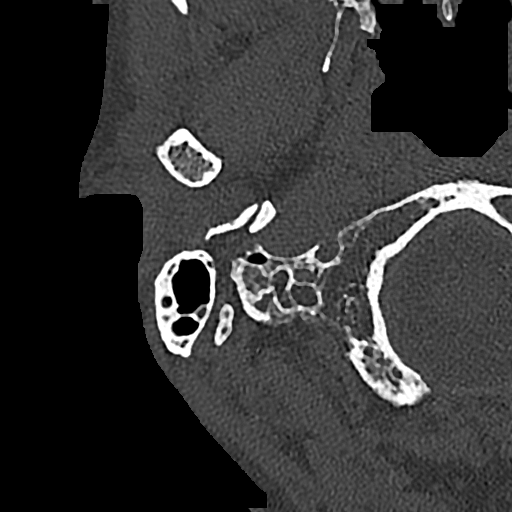
[im 15/83  bone]
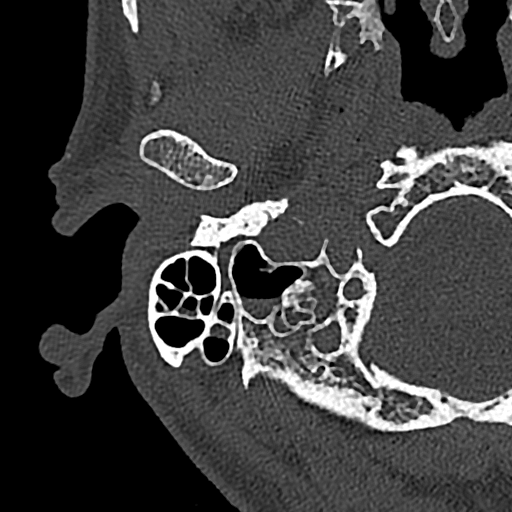
[im 23/83  bone]
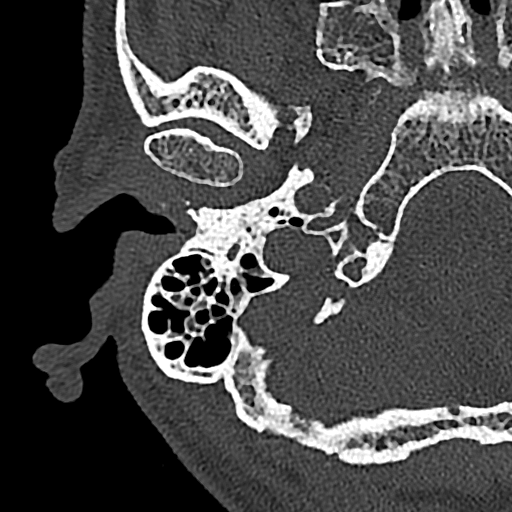
[im 30/83  bone]
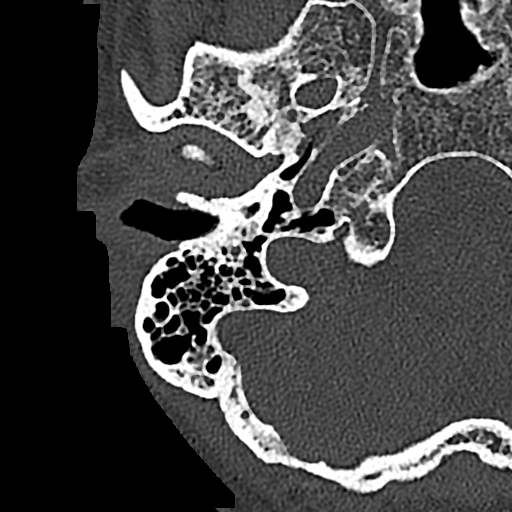
[im 38/83  brain]
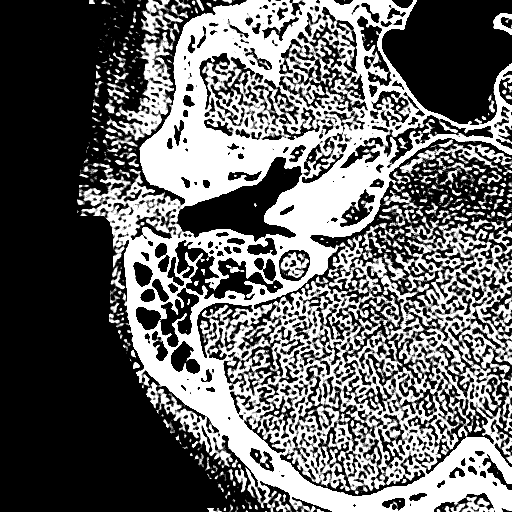
[im 38/83  bone]
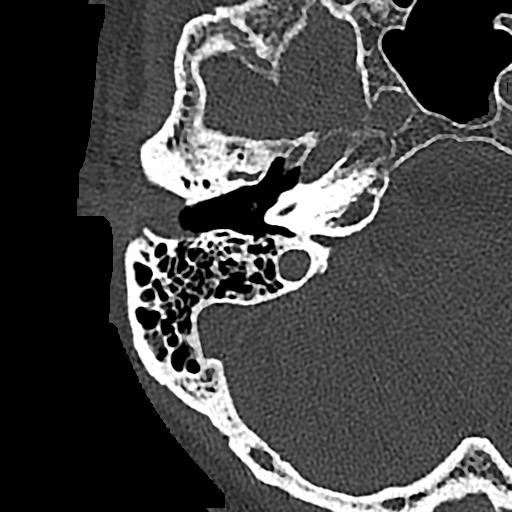
[im 45/83  bone]
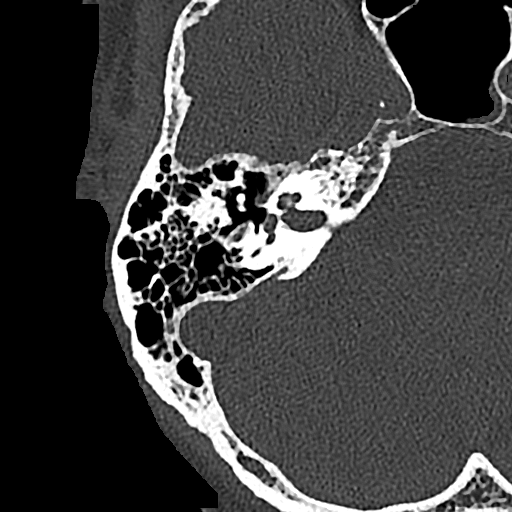
[im 53/83  bone]
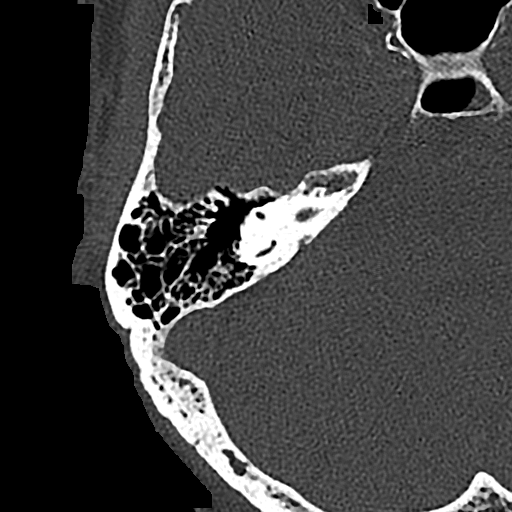
[im 60/83  bone]
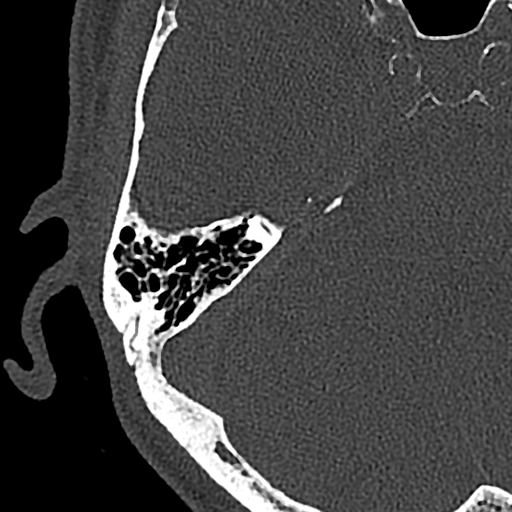
[im 68/83  brain]
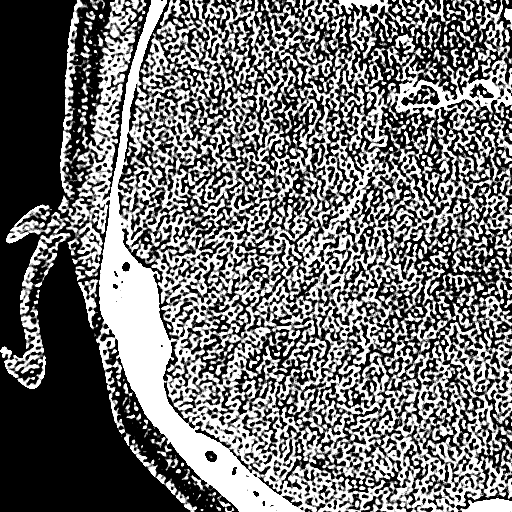
[im 68/83  bone]
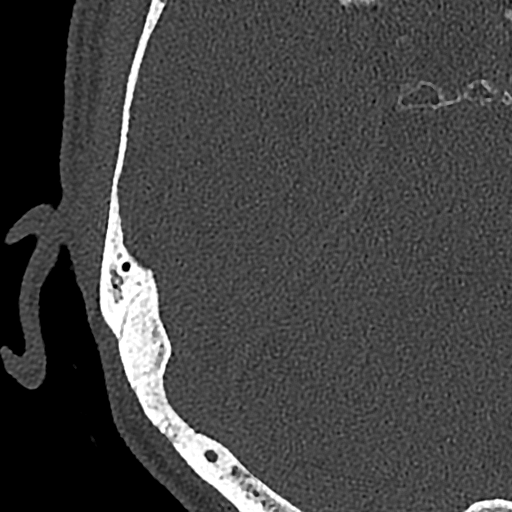
[im 75/83  bone]
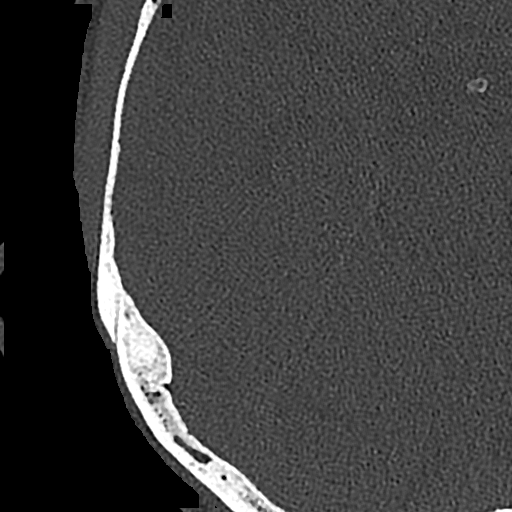

[14 of 40 positions shown; findings below may reference images not displayed]

FINDINGS: Right temporal bone: External auditory canal widely patent. Tympanic
membrane intact. Ossicular chain is normal. No fluid in the middle
ear or attic. Mastoid air cells are clear, except for a very few
opacified air cells at the mastoid tip. Inner ear structures on the
right appear normal.

Left temporal bone: External auditory canal is widely patent. Mild
thickening of the tympanic membrane. Ossicular chain raises the
possibility of mild subluxation at the articulation of the malleus
and incus. There is a temporal bone fracture on the left extending
through the mastoid air cells, but without breach of the otic
capsule. Most of the mastoid air cells are opacified by fluid.
Fracture lines extend into the calvarium in the squamous temporal
bone and occipital bone regions. No evidence of intracranial
complication such as subdural or epidural hematoma.

Note that C1 shows congenital incorporation into the skull base.
IMPRESSION: 1. Left temporal bone fracture extending through the mastoid air
cells, but without breach of the otic capsule. Most of the mastoid
air cells are opacified by fluid. Fracture lines extend into the
calvarium on the left in the squamous temporal bone and occipital
bone regions. No evidence of intracranial complication such as
subdural or epidural hematoma.
2. Mild thickening of the tympanic membrane on the left. Question
the possibility of mild subluxation at the articulation of the
malleus and incus. This is not definite.

## 2021-10-29 ENCOUNTER — Telehealth: Payer: Self-pay | Admitting: Cardiovascular Disease

## 2021-10-29 NOTE — Telephone Encounter (Signed)
Patient passed away per family.

## 8387-01-26 DEATH — deceased
